# Patient Record
Sex: Female | Born: 1990
Health system: Southern US, Community
[De-identification: ages and names within clinical notes are randomized; demographics above are authoritative.]

## PROBLEM LIST (undated history)

## (undated) DIAGNOSIS — L509 Urticaria, unspecified: Secondary | ICD-10-CM

## (undated) DIAGNOSIS — R569 Unspecified convulsions: Secondary | ICD-10-CM

## (undated) HISTORY — DX: Urticaria, unspecified: L50.9

---

## 2014-04-24 ENCOUNTER — Emergency Department (HOSPITAL_COMMUNITY): Payer: PRIVATE HEALTH INSURANCE

## 2014-04-24 ENCOUNTER — Encounter (HOSPITAL_COMMUNITY): Payer: Self-pay | Admitting: Emergency Medicine

## 2014-04-24 ENCOUNTER — Emergency Department (HOSPITAL_COMMUNITY)
Admission: EM | Admit: 2014-04-24 | Discharge: 2014-04-24 | Disposition: A | Discharge status: Home / Self Care | Payer: PRIVATE HEALTH INSURANCE | Attending: Emergency Medicine | Admitting: Emergency Medicine

## 2014-04-24 DIAGNOSIS — S0083XA Contusion of other part of head, initial encounter: Secondary | ICD-10-CM

## 2014-04-24 DIAGNOSIS — Y9389 Activity, other specified: Secondary | ICD-10-CM | POA: Diagnosis not present

## 2014-04-24 DIAGNOSIS — Z79899 Other long term (current) drug therapy: Secondary | ICD-10-CM | POA: Insufficient documentation

## 2014-04-24 DIAGNOSIS — Y92410 Unspecified street and highway as the place of occurrence of the external cause: Secondary | ICD-10-CM | POA: Insufficient documentation

## 2014-04-24 DIAGNOSIS — S01111A Laceration without foreign body of right eyelid and periocular area, initial encounter: Secondary | ICD-10-CM | POA: Diagnosis not present

## 2014-04-24 DIAGNOSIS — S6991XA Unspecified injury of right wrist, hand and finger(s), initial encounter: Secondary | ICD-10-CM | POA: Diagnosis not present

## 2014-04-24 HISTORY — DX: Unspecified convulsions: R56.9

## 2014-04-24 MED ORDER — ACETAMINOPHEN 325 MG PO TABS
650.0000 mg | ORAL_TABLET | Freq: Once | ORAL | Status: AC
  Administered 2014-04-24: 650 mg via ORAL
  Filled 2014-04-24: qty 2

## 2014-04-24 MED ORDER — NAPROXEN 500 MG PO TABS: 500.0000 mg | ORAL_TABLET | Freq: Two times a day (BID) | ORAL | Status: DC

## 2014-04-24 MED ORDER — HYDROCODONE-ACETAMINOPHEN 5-325 MG PO TABS: 2.0000 | ORAL_TABLET | ORAL | Status: DC | PRN

## 2014-04-24 MED ORDER — LIDOCAINE-EPINEPHRINE (PF) 2 %-1:200000 IJ SOLN
10.0000 mL | Freq: Once | INTRAMUSCULAR | Status: AC
  Administered 2014-04-24: 10 mL via INTRADERMAL
  Filled 2014-04-24: qty 20

## 2014-04-24 MED ORDER — METHOCARBAMOL 500 MG PO TABS: 500.0000 mg | ORAL_TABLET | Freq: Two times a day (BID) | ORAL | Status: DC
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
## 2014-04-24 NOTE — ED Notes (Signed)
MD at the bedside  

## 2014-04-24 NOTE — ED Notes (Signed)
MD Fayrene FearingJames stated patient did not need a pregnancy test.

## 2014-04-24 NOTE — Discharge Instructions (Signed)
Suture removal in 5-7 days.  Contusion A contusion is a deep bruise. Contusions are the result of an injury that caused bleeding under the skin. The contusion may turn blue, purple, or yellow. Minor injuries will give you a painless contusion, but more severe contusions may stay painful and swollen for a few weeks.  CAUSES  A contusion is usually caused by a blow, trauma, or direct force to an area of the body. SYMPTOMS   Swelling and redness of the injured area.  Bruising of the injured area.  Tenderness and soreness of the injured area.  Pain. DIAGNOSIS  The diagnosis can be made by taking a history and physical exam. An X-ray, CT scan, or MRI may be needed to determine if there were any associated injuries, such as fractures. TREATMENT  Specific treatment will depend on what area of the body was injured. In general, the best treatment for a contusion is resting, icing, elevating, and applying cold compresses to the injured area. Over-the-counter medicines may also be recommended for pain control. Ask your caregiver what the best treatment is for your contusion. HOME CARE INSTRUCTIONS   Put ice on the injured area.  Put ice in a plastic bag.  Place a towel between your skin and the bag.  Leave the ice on for 15-20 minutes, 3-4 times a day, or as directed by your health care provider.  Only take over-the-counter or prescription medicines for pain, discomfort, or fever as directed by your caregiver. Your caregiver may recommend avoiding anti-inflammatory medicines (aspirin, ibuprofen, and naproxen) for 48 hours because these medicines may increase bruising.  Rest the injured area.  If possible, elevate the injured area to reduce swelling. SEEK IMMEDIATE MEDICAL CARE IF:   You have increased bruising or swelling.  You have pain that is getting worse.  Your swelling or pain is not relieved with medicines. MAKE SURE YOU:   Understand these instructions.  Will watch your  condition.  Will get help right away if you are not doing well or get worse. Document Released: 03/24/2005 Document Revised: 06/19/2013 Document Reviewed: 04/19/2011 [facility] LodgeExitCare Patient Information 2015 [place]Care, [place]. This information is not intended to replace advice given to you by your health care provider. Make sure you discuss any questions you have with your health care provider.  Facial Laceration  A facial laceration is a cut on the face. These injuries can be painful and cause bleeding. Lacerations usually heal quickly, but they need special care to reduce scarring. DIAGNOSIS  Your health care provider will take a medical history, ask for details about how the injury occurred, and examine the wound to determine how deep the cut is. TREATMENT  Some facial lacerations may not require closure. Others may not be able to be closed because of an increased risk of infection. The risk of infection and the chance for successful closure will depend on various factors, including the amount of time since the injury occurred. The wound may be cleaned to help prevent infection. If closure is appropriate, pain medicines may be given if needed. Your health care provider will use stitches (sutures), wound glue (adhesive), or skin adhesive strips to repair the laceration. These tools bring the skin edges together to allow for faster healing and a better cosmetic outcome. If needed, you may also be given a tetanus shot. HOME CARE INSTRUCTIONS  Only take over-the-counter or prescription medicines as directed by your health care provider.  Follow your health care provider's instructions for wound care. These instructions  will vary depending on the technique used for closing the wound. For Sutures:  Keep the wound clean and dry.   If you were given a bandage (dressing), you should change it at least once a day. Also change the dressing if it becomes wet or dirty, or as directed by your health care provider.    Wash the wound with soap and water 2 times a day. Rinse the wound off with water to remove all soap. Pat the wound dry with a clean towel.   After cleaning, apply a thin layer of the antibiotic ointment recommended by your health care provider. This will help prevent infection and keep the dressing from sticking.   You may shower as usual after the first 24 hours. Do not soak the wound in water until the sutures are removed.   Get your sutures removed as directed by your health care provider. With facial lacerations, sutures should usually be taken out after 4-5 days to avoid stitch marks.   Wait a few days after your sutures are removed before applying any makeup. For Skin Adhesive Strips:  Keep the wound clean and dry.   Do not get the skin adhesive strips wet. You may bathe carefully, using caution to keep the wound dry.   If the wound gets wet, pat it dry with a clean towel.   Skin adhesive strips will fall off on their own. You may trim the strips as the wound heals. Do not remove skin adhesive strips that are still stuck to the wound. They will fall off in time.  For Wound Adhesive:  You may briefly wet your wound in the shower or bath. Do not soak or scrub the wound. Do not swim. Avoid periods of heavy sweating until the skin adhesive has fallen off on its own. After showering or bathing, gently pat the wound dry with a clean towel.   Do not apply liquid medicine, cream medicine, ointment medicine, or makeup to your wound while the skin adhesive is in place. This may loosen the film before your wound is healed.   If a dressing is placed over the wound, be careful not to apply tape directly over the skin adhesive. This may cause the adhesive to be pulled off before the wound is healed.   Avoid prolonged exposure to sunlight or tanning lamps while the skin adhesive is in place.  The skin adhesive will usually remain in place for 5-10 days, then naturally fall off the  skin. Do not pick at the adhesive film.  After Healing: Once the wound has healed, cover the wound with sunscreen during the day for 1 full year. This can help minimize scarring. Exposure to ultraviolet light in the first year will darken the scar. It can take 1-2 years for the scar to lose its redness and to heal completely.  SEEK IMMEDIATE MEDICAL CARE IF:  You have redness, pain, or swelling around the wound.   You see ayellowish-white fluid (pus) coming from the wound.   You have chills or a fever.  MAKE SURE YOU:  Understand these instructions.  Will watch your condition.  Will get help right away if you are not doing well or get worse. Document Released: 07/22/2004 Document Revised: 04/04/2013 Document Reviewed: 01/25/2013 [facility] Patient Information 2015 [place], [place]. This information is not intended to replace advice given to you by your health care provider. Make sure you discuss any questions you have with your health care provider.
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
## 2014-04-24 NOTE — ED Notes (Signed)
Patient returned from CT

## 2015-01-15 ENCOUNTER — Encounter: Payer: Self-pay | Admitting: Family Medicine
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
## 2016-03-27 ENCOUNTER — Ambulatory Visit (INDEPENDENT_AMBULATORY_CARE_PROVIDER_SITE_OTHER): Payer: BLUE CROSS/BLUE SHIELD

## 2016-03-27 DIAGNOSIS — Z23 Encounter for immunization: Secondary | ICD-10-CM

## 2016-07-13 DIAGNOSIS — Z124 Encounter for screening for malignant neoplasm of cervix: Secondary | ICD-10-CM | POA: Diagnosis not present

## 2016-07-13 DIAGNOSIS — Z309 Encounter for contraceptive management, unspecified: Secondary | ICD-10-CM | POA: Diagnosis not present

## 2016-07-13 DIAGNOSIS — Z01419 Encounter for gynecological examination (general) (routine) without abnormal findings: Secondary | ICD-10-CM | POA: Diagnosis not present

## 2017-05-18 DIAGNOSIS — Z23 Encounter for immunization: Secondary | ICD-10-CM | POA: Diagnosis not present

## 2017-10-28 ENCOUNTER — Ambulatory Visit (INDEPENDENT_AMBULATORY_CARE_PROVIDER_SITE_OTHER): Payer: BLUE CROSS/BLUE SHIELD | Admitting: Obstetrics & Gynecology

## 2017-10-28 ENCOUNTER — Encounter: Payer: Self-pay | Admitting: Obstetrics & Gynecology

## 2017-10-28 VITALS — BP 120/70 | Ht 67.0 in | Wt 144.0 lb

## 2017-10-28 DIAGNOSIS — Z124 Encounter for screening for malignant neoplasm of cervix: Secondary | ICD-10-CM | POA: Diagnosis not present

## 2017-10-28 DIAGNOSIS — Z0000 Encounter for general adult medical examination without abnormal findings: Secondary | ICD-10-CM | POA: Diagnosis not present

## 2017-10-28 MED ORDER — NUVARING 0.12-0.015 MG/24HR VA RING: VAGINAL_RING | VAGINAL | 12 refills | Status: DC

## 2017-10-28 NOTE — Progress Notes (Signed)
HPI:      [name] is a 27 y.o. G0P0000 who LMP was Patient's last menstrual period was 10/21/2017., she presents today for her annual examination. The patient has no complaints today. The patient is sexually active. Her last pap: approximate date 2018 normal and was normal and 2014 was ASCUS w normal biopsies then. The patient does perform self breast exams.  There is no notable family history of breast or ovarian cancer in her family.  The patient has regular exercise: yes.  The patient denies current symptoms of depression.    GYN History: Contraception: NuvaRing vaginal inserts  PMHx: Past Medical History:  Diagnosis Date  . Seizure [facility])    History reviewed. No pertinent surgical history. History reviewed. No pertinent family history. Social History   Tobacco Use  . Smoking status: Never Smoker  . Smokeless tobacco: Never Used  Substance Use Topics  . Alcohol use: Yes  . Drug use: Not Currently    Current Outpatient Medications:  .  NUVARING 0.12-0.015 MG/24HR vaginal ring, INSERT 1 RING VAGINALLY ONCE A MONTH LEAVE IN PLACE FOR 24 DAYS. REMOVE FOR 4 DAYS, Disp: , Rfl: 12 .  acetaminophen (TYLENOL) 500 MG tablet, Take 500 mg by mouth every 6 (six) hours as needed., Disp: , Rfl:  .  HYDROcodone-acetaminophen (NORCO/VICODIN) 5-325 MG per tablet, Take 2 tablets by mouth every 4 (four) hours as needed. (Patient not taking: Reported on 10/28/2017), Disp: 10 tablet, Rfl: 0 .  methocarbamol (ROBAXIN) 500 MG tablet, Take 1 tablet (500 mg total) by mouth 2 (two) times daily. (Patient not taking: Reported on 10/28/2017), Disp: 20 tablet, Rfl: 0 .  naproxen (NAPROSYN) 500 MG tablet, Take 1 tablet (500 mg total) by mouth 2 (two) times daily. (Patient not taking: Reported on 10/28/2017), Disp: 30 tablet, Rfl: 0 .  norethindrone-ethinyl estradiol (JUNEL FE,GILDESS FE,LOESTRIN FE) 1-20 MG-MCG tablet, Take 1 tablet by mouth daily., Disp: , Rfl:  Allergies: Patient has no known  allergies.  Review of Systems  Constitutional: Negative for chills, fever and malaise/fatigue.  HENT: Negative for congestion, sinus pain and sore throat.   Eyes: Negative for blurred vision and pain.  Respiratory: Negative for cough and wheezing.   Cardiovascular: Negative for chest pain and leg swelling.  Gastrointestinal: Negative for abdominal pain, constipation, diarrhea, heartburn, nausea and vomiting.  Genitourinary: Negative for dysuria, frequency, hematuria and urgency.  Musculoskeletal: Negative for back pain, joint pain, myalgias and neck pain.  Skin: Negative for itching and rash.  Neurological: Negative for dizziness, tremors and weakness.  Endo/Heme/Allergies: Does not bruise/bleed easily.  Psychiatric/Behavioral: Negative for depression. The patient is not nervous/anxious and does not have insomnia.    Objective: BP 120/70   Ht  (1.702 m)   Wt 144 lb (65.3 kg)   LMP 10/21/2017   BMI 22.55 kg/m   Filed Weights   10/28/17 0816  Weight: 144 lb (65.3 kg)   Body mass index is 22.55 kg/m. Physical Exam  Constitutional: She is oriented to person, place, and time. She appears well-developed and well-nourished. No distress.  Genitourinary: Rectum normal, vagina normal and uterus normal. Pelvic exam was performed with patient supine. There is no rash or lesion on the right labia. There is no rash or lesion on the left labia. Vagina exhibits no lesion. No bleeding in the vagina. Right adnexum does not display mass and does not display tenderness. Left adnexum does not display mass and does not display tenderness. Cervix does not exhibit motion tenderness,  lesion, friability or polyp.   Uterus is mobile and midaxial. Uterus is not enlarged or exhibiting a mass.  HENT:  Head: Normocephalic and atraumatic. Head is without laceration.  Right Ear: Hearing normal.  Left Ear: Hearing normal.  Nose: No epistaxis.  No foreign bodies.  Mouth/Throat: Uvula is midline, oropharynx  is clear and moist and mucous membranes are normal.  Eyes: Pupils are equal, round, and reactive to light.  Neck: Normal range of motion. Neck supple. No thyromegaly present.  Cardiovascular: Normal rate and regular rhythm. Exam reveals no gallop and no friction rub.  No murmur heard. Pulmonary/Chest: Effort normal and breath sounds normal. No respiratory distress. She has no wheezes. Right breast exhibits no mass, no skin change and no tenderness. Left breast exhibits no mass, no skin change and no tenderness.  Abdominal: Soft. Bowel sounds are normal. She exhibits no distension. There is no tenderness. There is no rebound.  Musculoskeletal: Normal range of motion.  Neurological: She is alert and oriented to person, place, and time. No cranial nerve deficit.  Skin: Skin is warm and dry.  Psychiatric: She has a normal mood and affect. Judgment normal.  Vitals reviewed.  Assessment:   1. Annual physical exam   2. Screening for cervical cancer    Screening Plan:            1.  Cervical Screening-  Pap smear done today, annually  2.  Labs managed by PCP  5.  Counseling for contraception: NuvaRing vaginal inserts    F/U  Return in about 1 year (around 10/29/2018) for Annual.  Annamarie Major, MD, [facility], [facility] 10/28/2017  8:21 AM

## 2017-10-28 NOTE — Patient Instructions (Signed)
Ethinyl Estradiol; Etonogestrel vaginal ring What is this medicine? ETHINYL ESTRADIOL; ETONOGESTREL (ETH in il es tra DYE ole; et oh noe JES trel) vaginal ring is a flexible, vaginal ring used as a contraceptive (birth control method). This medicine combines two types of female hormones, an estrogen and a progestin. This ring is used to prevent ovulation and pregnancy. Each ring is effective for one month. This medicine may be used for other purposes; ask your health care provider or pharmacist if you have questions. COMMON BRAND NAME(S): NuvaRing What should I tell my health care provider before I take this medicine? They need to know if you have or ever had any of these conditions: -abnormal vaginal bleeding -blood vessel disease or blood clots -breast, cervical, endometrial, ovarian, liver, or uterine cancer -diabetes -gallbladder disease -heart disease or recent heart attack -high blood pressure -high cholesterol -kidney disease -liver disease -migraine headaches -stroke -systemic lupus erythematosus (SLE) -tobacco smoker -an unusual or allergic reaction to estrogens, progestins, other medicines, foods, dyes, or preservatives -pregnant or trying to get pregnant -breast-feeding How should I use this medicine? Insert the ring into your vagina as directed. Follow the directions on the prescription label. The ring will remain place for 3 weeks and is then removed for a 1-week break. A new ring is inserted 1 week after the last ring was removed, on the same day of the week. Check often to make sure the ring is still in place, especially before and after sexual intercourse. If the ring was out of the vagina for an unknown amount of time, you may not be protected from pregnancy. Perform a pregnancy test and call your doctor. Do not use more often than directed. A patient package insert for the product will be given with each prescription and refill. Read this sheet carefully each time. The  sheet may change frequently. Contact your pediatrician regarding the use of this medicine in children. Special care may be needed. This medicine has been used in female children who have started having menstrual periods. Overdosage: If you think you have taken too much of this medicine contact a poison control center or emergency room at once. NOTE: This medicine is only for you. Do not share this medicine with others. What if I miss a dose? You will need to replace your vaginal ring once a month as directed. If the ring should slip out, or if you leave it in longer or shorter than you should, contact your health care professional for advice. What may interact with this medicine? Do not take this medicine with the following medication: -dasabuvir; ombitasvir; paritaprevir; ritonavir -ombitasvir; paritaprevir; ritonavir This medicine may also interact with the following medications: -acetaminophen -antibiotics or medicines for infections, especially rifampin, rifabutin, rifapentine, and griseofulvin, and possibly penicillins or tetracyclines -aprepitant -ascorbic acid (vitamin C) -atorvastatin -barbiturate medicines, such as phenobarbital -bosentan -carbamazepine -caffeine -clofibrate -cyclosporine -dantrolene -doxercalciferol -felbamate -grapefruit juice -hydrocortisone -medicines for anxiety or sleeping problems, such as diazepam or temazepam -medicines for diabetes, including pioglitazone -modafinil -mycophenolate -nefazodone -oxcarbazepine -phenytoin -prednisolone -ritonavir or other medicines for HIV infection or AIDS -rosuvastatin -selegiline -soy isoflavones supplements -St. John's wort -tamoxifen or raloxifene -theophylline -thyroid hormones -topiramate -warfarin This list may not describe all possible interactions. Give your health care provider a list of all the medicines, herbs, non-prescription drugs, or dietary supplements you use. Also tell them if you smoke,  drink alcohol, or use illegal drugs. Some items may interact with your medicine. What should I watch for while using   this medicine? Visit your doctor or health care professional for regular checks on your progress. You will need a regular breast and pelvic exam and Pap smear while on this medicine. Use an additional method of contraception during the first cycle that you use this ring. Do not use a diaphragm or female condom, as the ring can interfere with these birth control methods and their proper placement. If you have any reason to think you are pregnant, stop using this medicine right away and contact your doctor or health care professional. If you are using this medicine for hormone related problems, it may take several cycles of use to see improvement in your condition. Smoking increases the risk of getting a blood clot or having a stroke while you are using hormonal birth control, especially if you are more than 29 years old. You are strongly advised not to smoke. This medicine can make your body retain fluid, making your fingers, hands, or ankles swell. Your blood pressure can go up. Contact your doctor or health care professional if you feel you are retaining fluid. This medicine can make you more sensitive to the sun. Keep out of the sun. If you cannot avoid being in the sun, wear protective clothing and use sunscreen. Do not use sun lamps or tanning beds/booths. If you wear contact lenses and notice visual changes, or if the lenses begin to feel uncomfortable, consult your eye care specialist. In some women, tenderness, swelling, or minor bleeding of the gums may occur. Notify your dentist if this happens. Brushing and flossing your teeth regularly may help limit this. See your dentist regularly and inform your dentist of the medicines you are taking. If you are going to have elective surgery, you may need to stop using this medicine before the surgery. Consult your health care professional  for advice. This medicine does not protect you against HIV infection (AIDS) or any other sexually transmitted diseases. What side effects may I notice from receiving this medicine? Side effects that you should report to your doctor or health care professional as soon as possible: -breast tissue changes or discharge -changes in vaginal bleeding during your period or between your periods -chest pain -coughing up blood -dizziness or fainting spells -headaches or migraines -leg, arm or groin pain -severe or sudden headaches -stomach pain (severe) -sudden shortness of breath -sudden loss of coordination, especially on one side of the body -speech problems -symptoms of vaginal infection like itching, irritation or unusual discharge -tenderness in the upper abdomen -vomiting -weakness or numbness in the arms or legs, especially on one side of the body -yellowing of the eyes or skin Side effects that usually do not require medical attention (report to your doctor or health care professional if they continue or are bothersome): -breakthrough bleeding and spotting that continues beyond the 3 initial cycles of pills -breast tenderness -mood changes, anxiety, depression, frustration, anger, or emotional outbursts -increased sensitivity to sun or ultraviolet light -nausea -skin rash, acne, or brown spots on the skin -weight gain (slight) This list may not describe all possible side effects. Call your doctor for medical advice about side effects. You may report side effects to FDA at 1-800-FDA-1088. Where should I keep my medicine? Keep out of the reach of children. Store at room temperature between 15 and 30 degrees C (59 and 86 degrees F) for up to 4 months. The product will expire after 4 months. Protect from light. Throw away any unused medicine after the expiration date. NOTE: This   sheet is a summary. It may not cover all possible information. If you have questions about this medicine, talk  to your doctor, pharmacist, or health care provider.  2018 Elsevier/Gold Standard (2016-02-20 17:00:31)  

## 2017-11-01 LAB — IGP, RFX APTIMA HPV ASCU: PAP Smear Comment: 0

## 2017-11-06 ENCOUNTER — Other Ambulatory Visit: Payer: Self-pay | Admitting: Obstetrics & Gynecology

## 2018-06-19 ENCOUNTER — Ambulatory Visit (INDEPENDENT_AMBULATORY_CARE_PROVIDER_SITE_OTHER): Payer: BLUE CROSS/BLUE SHIELD | Admitting: Family Medicine

## 2018-06-19 ENCOUNTER — Encounter: Payer: Self-pay | Admitting: Family Medicine

## 2018-06-19 VITALS — BP 122/68 | HR 84 | Temp 98.4°F | Resp 16 | Ht 67.0 in | Wt 149.0 lb

## 2018-06-19 DIAGNOSIS — K121 Other forms of stomatitis: Secondary | ICD-10-CM

## 2018-06-19 DIAGNOSIS — Z0000 Encounter for general adult medical examination without abnormal findings: Secondary | ICD-10-CM | POA: Diagnosis not present

## 2018-06-19 NOTE — Progress Notes (Signed)
.   Patient: Audrey Mullins, Female    DOB: September 25, 1990, 27 y.o.   MRN: [id number] Visit Date: 06/19/2018  Today's Provider: Megan Mansichard Gilbert Jr, MD   Chief Complaint  Patient presents with  . Annual Exam   Subjective:    Annual physical exam Audrey QuailsJessica Gawron is a 27 y.o. female who presents today for health maintenance and complete physical. She feels well. She reports exercising not regularly, but she does stay active. She reports she is sleeping well. GYN care per Dr. Tiburcio PeaHarris. She is presently pursuing her masters degree at [employer] and will graduate in May 2021.  Boyfriend is pursuing his PhD.  She had the flu shot.  Overall she feels well.  Only issue she has now is recurrent mouth ulcers for which she takes  Dukes mouthwash successfully Tdap- 08/26/2009.  Pap- 10/28/2017. GYN. Normal.     Review of Systems  Constitutional: Negative.   HENT: Negative.   Eyes: Negative.   Respiratory: Negative.   Cardiovascular: Negative.   Gastrointestinal: Negative.   Endocrine: Negative.   Genitourinary: Negative.   Musculoskeletal: Negative.   Skin: Negative.   Allergic/Immunologic: Negative.   Neurological: Negative.   Hematological: Negative.   Psychiatric/Behavioral: Negative.     Social History      She  reports that she has never smoked. She has never used smokeless tobacco. She reports current alcohol use. She reports previous drug use.       Social History   Socioeconomic History  . Marital status: Single    Spouse name: Not on file  . Number of children: Not on file  . Years of education: Not on file  . Highest education level: Not on file  Occupational History  . Not on file  Social Needs  . Financial resource strain: Not on file  . Food insecurity:    Worry: Not on file    Inability: Not on file  . Transportation needs:    Medical: Not on file    Non-medical: Not on file  Tobacco Use  . Smoking status: Never Smoker  . Smokeless tobacco: Never Used  Substance  and Sexual Activity  . Alcohol use: Yes  . Drug use: Not Currently  . Sexual activity: Yes    Birth control/protection: Inserts  Lifestyle  . Physical activity:    Days per week: Not on file    Minutes per session: Not on file  . Stress: Not on file  Relationships  . Social connections:    Talks on phone: Not on file    Gets together: Not on file    Attends religious service: Not on file    Active member of club or organization: Not on file    Attends meetings of clubs or organizations: Not on file    Relationship status: Not on file  Other Topics Concern  . Not on file  Social History Narrative  . Not on file    Past Medical History:  Diagnosis Date  . Seizure (HCC)      There are no active problems to display for this patient.   No past surgical history on file.  Family History        Family Status  Relation Name Status  . Mother  Alive  . Father  Alive  . Sister  Alive  . Brother  Alive  . Brother  Alive  . Brother  Alive        Her family history is not on  file.      No Known Allergies   Current Outpatient Medications:  .  acetaminophen (TYLENOL) 500 MG tablet, Take 500 mg by mouth every 6 (six) hours as needed., Disp: , Rfl:  .  NUVARING 0.12-0.015 MG/24HR vaginal ring, INSERT 1 RING VAGINALLY ONCE A MONTH LEAVE IN PLACE FOR 24 DAYS. REMOVE FOR 4 DAYS, Disp: 3 each, Rfl: 4 .  HYDROcodone-acetaminophen (NORCO/VICODIN) 5-325 MG per tablet, Take 2 tablets by mouth every 4 (four) hours as needed. (Patient not taking: Reported on 10/28/2017), Disp: 10 tablet, Rfl: 0 .  methocarbamol (ROBAXIN) 500 MG tablet, Take 1 tablet (500 mg total) by mouth 2 (two) times daily. (Patient not taking: Reported on 10/28/2017), Disp: 20 tablet, Rfl: 0 .  naproxen (NAPROSYN) 500 MG tablet, Take 1 tablet (500 mg total) by mouth 2 (two) times daily. (Patient not taking: Reported on 10/28/2017), Disp: 30 tablet, Rfl: 0   Patient Care Team: Maple Hudson., MD as PCP - General  (Family Medicine)      Objective:   Vitals: BP 122/68 (BP Location: Right Arm, Patient Position: Sitting, Cuff Size: Normal)   Pulse 84   Temp 98.4 F (36.9 C)   Resp 16   Ht 5\' 7"  (1.702 m)   Wt 149 lb (67.6 kg)   SpO2 97%   BMI 23.34 kg/m    Vitals:   06/19/18 1345  BP: 122/68  Pulse: 84  Resp: 16  Temp: 98.4 F (36.9 C)  SpO2: 97%  Weight: 149 lb (67.6 kg)  Height: 5\' 7"  (1.702 m)     Physical Exam Vitals signs reviewed.  Constitutional:      Appearance: Normal appearance.  HENT:     Head: Normocephalic and atraumatic.     Right Ear: Tympanic membrane and external ear normal.     Left Ear: Tympanic membrane and external ear normal.     Nose: Nose normal.     Mouth/Throat:     Pharynx: Oropharynx is clear.  Eyes:     General: No scleral icterus.    Conjunctiva/sclera: Conjunctivae normal.     Pupils: Pupils are equal, round, and reactive to light.  Cardiovascular:     Rate and Rhythm: Normal rate and regular rhythm.     Pulses: Normal pulses.     Heart sounds: Normal heart sounds.  Pulmonary:     Effort: Pulmonary effort is normal.     Breath sounds: Normal breath sounds.  Abdominal:     General: Abdomen is flat.     Palpations: Abdomen is soft.  Musculoskeletal:        General: No swelling.  Lymphadenopathy:     Cervical: No cervical adenopathy.  Skin:    General: Skin is warm and dry.  Neurological:     General: No focal deficit present.     Mental Status: She is alert and oriented to person, place, and time. Mental status is at baseline.  Psychiatric:        Mood and Affect: Mood normal.        Behavior: Behavior normal.        Thought Content: Thought content normal.        Judgment: Judgment normal.      Depression Screen PHQ 2/9 Scores 06/19/2018  PHQ - 2 Score 1      Assessment & Plan:     Routine Health Maintenance and Physical Exam  Exercise Activities and Dietary recommendations Goals   None  Immunization History    Administered Date(s) Administered  . Influenza,inj,Quad PF,6+ Mos 03/27/2016    Health Maintenance  Topic Date Due  . HIV Screening  11/14/2003  . TETANUS/TDAP  11/14/2007  . PAP-Cervical Cytology Screening  11/13/2009  . INFLUENZA VACCINE  01/26/2018  . PAP SMEAR-Modifier  10/28/2020    Overall very healthy.  Suggest using lysine daily to try to prevent the mouth ulcers.  Probable Tdap on her next physical in a couple of years Discussed health benefits of physical activity, and encouraged her to engage in regular exercise appropriate for her age and condition.  I have done the exam and reviewed the above chart and it is accurate to the best of my knowledge. [employer]  technology has been used in this note in any air is in the dictation or transcription are unintentional.  Megan Mansichard Gilbert Jr, MD  [facility]

## 2018-06-19 NOTE — Patient Instructions (Signed)
Try Lysine daily to help with mouth ulcers.

## 2018-06-20 LAB — POCT URINALYSIS DIPSTICK
Bilirubin, UA: NEGATIVE
Blood, UA: NEGATIVE
GLUCOSE UA: NEGATIVE
Ketones, UA: NEGATIVE
Leukocytes, UA: NEGATIVE
Nitrite, UA: NEGATIVE
Protein, UA: NEGATIVE
SPEC GRAV UA: 1.01 (ref 1.010–1.025)
UROBILINOGEN UA: 0.2 U/dL
pH, UA: 6.5 (ref 5.0–8.0)

## 2018-06-20 LAB — COMPREHENSIVE METABOLIC PANEL
ALBUMIN: 4.3 g/dL (ref 3.5–5.5)
ALT: 13 IU/L (ref 0–32)
AST: 14 IU/L (ref 0–40)
Albumin/Globulin Ratio: 2.3 — ABNORMAL HIGH (ref 1.2–2.2)
Alkaline Phosphatase: 68 IU/L (ref 39–117)
BUN/Creatinine Ratio: 10 (ref 9–23)
BUN: 8 mg/dL (ref 6–20)
Bilirubin Total: 0.3 mg/dL (ref 0.0–1.2)
CO2: 23 mmol/L (ref 20–29)
CREATININE: 0.82 mg/dL (ref 0.57–1.00)
Calcium: 9.2 mg/dL (ref 8.7–10.2)
Chloride: 101 mmol/L (ref 96–106)
GFR calc Af Amer: 112 mL/min/{1.73_m2} (ref >59)
GFR calc non Af Amer: 97 mL/min/{1.73_m2} (ref >59)
GLOBULIN, TOTAL: 1.9 g/dL (ref 1.5–4.5)
Glucose: 86 mg/dL (ref 65–99)
Potassium: 4.2 mmol/L (ref 3.5–5.2)
SODIUM: 139 mmol/L (ref 134–144)
Total Protein: 6.2 g/dL (ref 6.0–8.5)

## 2018-06-20 LAB — TSH: TSH: 1.53 u[IU]/mL (ref 0.450–4.500)

## 2018-06-20 LAB — LIPID PANEL
CHOL/HDL RATIO: 2.1 ratio (ref 0.0–4.4)
Cholesterol, Total: 171 mg/dL (ref 100–199)
HDL: 83 mg/dL (ref >39)
LDL CALC: 79 mg/dL (ref 0–99)
TRIGLYCERIDES: 47 mg/dL (ref 0–149)
VLDL Cholesterol Cal: 9 mg/dL (ref 5–40)

## 2018-06-20 LAB — CBC WITH DIFFERENTIAL/PLATELET
Basophils Absolute: 0.1 10*3/uL (ref 0.0–0.2)
Basos: 1 %
EOS (ABSOLUTE): 0.4 10*3/uL (ref 0.0–0.4)
EOS: 8 %
HEMATOCRIT: 39.9 % (ref 34.0–46.6)
HEMOGLOBIN: 13.8 g/dL (ref 11.1–15.9)
IMMATURE GRANULOCYTES: 1 %
Immature Grans (Abs): 0 10*3/uL (ref 0.0–0.1)
Lymphocytes Absolute: 2 10*3/uL (ref 0.7–3.1)
Lymphs: 37 %
MCH: 31.2 pg (ref 26.6–33.0)
MCHC: 34.6 g/dL (ref 31.5–35.7)
MCV: 90 fL (ref 79–97)
Monocytes Absolute: 0.5 10*3/uL (ref 0.1–0.9)
Monocytes: 8 %
Neutrophils Absolute: 2.4 10*3/uL (ref 1.4–7.0)
Neutrophils: 45 %
Platelets: 275 10*3/uL (ref 150–450)
RBC: 4.43 x10E6/uL (ref 3.77–5.28)
RDW: 12.1 % — ABNORMAL LOW (ref 12.3–15.4)
WBC: 5.3 10*3/uL (ref 3.4–10.8)

## 2018-11-15 ENCOUNTER — Other Ambulatory Visit: Payer: Self-pay | Admitting: Obstetrics & Gynecology

## 2018-11-16 NOTE — Telephone Encounter (Signed)
Sch annual in Jun-July Refills done

## 2018-11-16 NOTE — Telephone Encounter (Signed)
Called and left generic message for patient to call back to be schedule °

## 2019-02-03 ENCOUNTER — Other Ambulatory Visit: Payer: Self-pay | Admitting: Obstetrics & Gynecology

## 2019-02-05 NOTE — Telephone Encounter (Signed)
Voicemail box is full unable to leave voicemail 

## 2019-02-05 NOTE — Telephone Encounter (Signed)
Sch Annual; refill done

## 2019-05-01 ENCOUNTER — Other Ambulatory Visit: Payer: Self-pay | Admitting: Obstetrics & Gynecology

## 2019-07-02 DIAGNOSIS — J301 Allergic rhinitis due to pollen: Secondary | ICD-10-CM | POA: Diagnosis not present

## 2019-07-02 DIAGNOSIS — J305 Allergic rhinitis due to food: Secondary | ICD-10-CM | POA: Diagnosis not present

## 2019-07-02 DIAGNOSIS — J343 Hypertrophy of nasal turbinates: Secondary | ICD-10-CM | POA: Diagnosis not present

## 2019-07-02 DIAGNOSIS — J309 Allergic rhinitis, unspecified: Secondary | ICD-10-CM | POA: Diagnosis not present

## 2019-07-02 DIAGNOSIS — L500 Allergic urticaria: Secondary | ICD-10-CM | POA: Diagnosis not present

## 2019-07-02 DIAGNOSIS — J340 Abscess, furuncle and carbuncle of nose: Secondary | ICD-10-CM | POA: Diagnosis not present

## 2019-07-03 DIAGNOSIS — L858 Other specified epidermal thickening: Secondary | ICD-10-CM | POA: Diagnosis not present

## 2019-07-03 DIAGNOSIS — L209 Atopic dermatitis, unspecified: Secondary | ICD-10-CM | POA: Diagnosis not present

## 2019-07-03 DIAGNOSIS — L309 Dermatitis, unspecified: Secondary | ICD-10-CM | POA: Diagnosis not present

## 2019-07-03 DIAGNOSIS — L503 Dermatographic urticaria: Secondary | ICD-10-CM | POA: Diagnosis not present

## 2019-07-25 DIAGNOSIS — L500 Allergic urticaria: Secondary | ICD-10-CM | POA: Diagnosis not present

## 2019-08-08 DIAGNOSIS — L209 Atopic dermatitis, unspecified: Secondary | ICD-10-CM | POA: Diagnosis not present

## 2019-08-08 DIAGNOSIS — L503 Dermatographic urticaria: Secondary | ICD-10-CM | POA: Diagnosis not present

## 2019-08-08 DIAGNOSIS — L710 Perioral dermatitis: Secondary | ICD-10-CM | POA: Diagnosis not present

## 2019-09-03 DIAGNOSIS — L209 Atopic dermatitis, unspecified: Secondary | ICD-10-CM | POA: Diagnosis not present

## 2019-09-05 DIAGNOSIS — L209 Atopic dermatitis, unspecified: Secondary | ICD-10-CM | POA: Diagnosis not present

## 2019-09-10 ENCOUNTER — Other Ambulatory Visit: Payer: Self-pay

## 2019-09-10 ENCOUNTER — Ambulatory Visit: Payer: BC Managed Care – PPO | Admitting: Dermatology

## 2019-09-10 DIAGNOSIS — L710 Perioral dermatitis: Secondary | ICD-10-CM

## 2019-09-10 DIAGNOSIS — L249 Irritant contact dermatitis, unspecified cause: Secondary | ICD-10-CM | POA: Diagnosis not present

## 2019-09-10 DIAGNOSIS — L209 Atopic dermatitis, unspecified: Secondary | ICD-10-CM

## 2019-09-10 NOTE — Progress Notes (Addendum)
   Follow-Up Visit   Subjective  Audrey Mullins is a 29 y.o. female who presents for the following: Dermatitis (Pt did notice some redness on the wool alcohol patch site on the first day, but nothing thereafter. ). Dermatitis She complains of a rash. Symptoms began  06/30/2019 . Patient describes the rash as macules. Characteristics of rash and associated history: Is rash pruritic?  yes. Her previous dermatologic history includes dermatitis. Medications currently using:  Aczone 7.5% gel QAM for perioral dermatitis and Mometasone 0.1% cream QD-BID PRN for atopic dermatitis.   Environmental exposures or allergies: rag weed - high positive during allergy testing at [facility], peanuts - tested positive during allergy testing at [facility], but no physical reaction as of yet.    The following portions of the chart were reviewed this encounter and updated as appropriate:     Review of Systems: No other skin or systemic complaints.  Objective  Well appearing patient in no apparent distress; mood and affect are within normal limits.  An examination of the back, and face was performed today.   Objective  Face, trunk, extremities: Trunk and extremities clear today. Patch test location clear today.  Objective  Face, trunk, extremities: Patch testing site clear.   Objective  Periocular:   Face clear today other than 0.1 cm x 2 pink papules of the R periocular area.  Assessment & Plan  Atopic dermatitis, unspecified type Face, trunk, extremities  Continue Mometasone cream 0.1% QD-BID PRN flares. Recommend mild soaps and moisturizers daily. Patch testing clear today but questionable reaction to wool alcohols and ethylenediamine dihydrochloride. Pt should avoid these products and products containing these ingredients. Chemical product informational sheets given today.  Irritant dermatitis Face, trunk, extremities  The patient will observe these symptoms, and  report promptly any worsening or unexpected persistence.  If well, may return prn. Informational hand outs given for wool alcohol (+ first day per pt.) and ethylenediamine dihydrochloride (questionable)  Perioral dermatitis Periocular  The patient will observe these symptoms, and report promptly any worsening or unexpected persistence.  If well, may return prn. Wool alcohol and ethylenediamine dihydrochloride handouts given today. Continue Aczone 7.5% gel QAM. Return if symptoms worsen or fail to improve.

## 2019-09-11 ENCOUNTER — Other Ambulatory Visit: Payer: Self-pay | Admitting: Obstetrics & Gynecology

## 2019-09-11 ENCOUNTER — Telehealth: Payer: Self-pay

## 2019-09-11 MED ORDER — NUVARING 0.12-0.015 MG/24HR VA RING: VAGINAL_RING | VAGINAL | 0 refills | Status: DC

## 2019-09-11 NOTE — Telephone Encounter (Signed)
Pt is wanting a refill on her Nuvaring before her annual that's coming up however she is almost a year over due for her annual please advise if refill is okay. Thank you.

## 2019-09-27 DIAGNOSIS — H5213 Myopia, bilateral: Secondary | ICD-10-CM | POA: Diagnosis not present

## 2019-10-08 ENCOUNTER — Ambulatory Visit: Payer: BC Managed Care – PPO | Admitting: Obstetrics & Gynecology

## 2019-11-01 ENCOUNTER — Other Ambulatory Visit (HOSPITAL_COMMUNITY)
Admission: RE | Admit: 2019-11-01 | Discharge: 2019-11-01 | Disposition: A | Discharge status: Home / Self Care | Payer: BC Managed Care – PPO | Source: Ambulatory Visit | Attending: Obstetrics & Gynecology | Admitting: Obstetrics & Gynecology

## 2019-11-01 ENCOUNTER — Ambulatory Visit (INDEPENDENT_AMBULATORY_CARE_PROVIDER_SITE_OTHER): Payer: BC Managed Care – PPO | Admitting: Obstetrics & Gynecology

## 2019-11-01 ENCOUNTER — Other Ambulatory Visit: Payer: Self-pay

## 2019-11-01 ENCOUNTER — Encounter: Payer: Self-pay | Admitting: Obstetrics & Gynecology

## 2019-11-01 VITALS — BP 120/80 | Ht 67.0 in | Wt 134.0 lb

## 2019-11-01 DIAGNOSIS — Z3044 Encounter for surveillance of vaginal ring hormonal contraceptive device: Secondary | ICD-10-CM

## 2019-11-01 DIAGNOSIS — Z124 Encounter for screening for malignant neoplasm of cervix: Secondary | ICD-10-CM | POA: Diagnosis not present

## 2019-11-01 DIAGNOSIS — Z01419 Encounter for gynecological examination (general) (routine) without abnormal findings: Secondary | ICD-10-CM

## 2019-11-01 MED ORDER — NUVARING 0.12-0.015 MG/24HR VA RING: VAGINAL_RING | VAGINAL | 3 refills | Status: DC

## 2019-11-01 NOTE — Progress Notes (Signed)
HPI:      [name] is a 29 y.o. G0P0000 who LMP was Patient's last menstrual period was 10/26/2019., she presents today for her annual examination. The patient has no complaints today. The patient is sexually active. Her last pap: approximate date 2019 and was normal. The patient does perform self breast exams.  There is no notable family history of breast or ovarian cancer in her family.  The patient has regular exercise: yes.  The patient denies current symptoms of depression.  Reg cycles, 6 days.  GYN History: Contraception: NuvaRing vaginal inserts  PMHx: Past Medical History:  Diagnosis Date  . Seizure (HCC)   . Urticaria    History reviewed. No pertinent surgical history. Family History  Problem Relation Age of Onset  . Skin cancer Mother    Social History   Tobacco Use  . Smoking status: Never Smoker  . Smokeless tobacco: Never Used  Substance Use Topics  . Alcohol use: Yes  . Drug use: Not Currently    Current Outpatient Medications:  .  acetaminophen (TYLENOL) 500 MG tablet, Take 500 mg by mouth every 6 (six) hours as needed., Disp: , Rfl:  .  NUVARING 0.12-0.015 MG/24HR vaginal ring, Insert vaginally and leave in place for 3 consecutive weeks, then remove for 1 week., Disp: 3 each, Rfl: 3 Allergies: Mixed ragweed and Peanut-containing drug products  Review of Systems  Constitutional: Negative for chills, fever and malaise/fatigue.  HENT: Negative for congestion, sinus pain and sore throat.   Eyes: Negative for blurred vision and pain.  Respiratory: Negative for cough and wheezing.   Cardiovascular: Negative for chest pain and leg swelling.  Gastrointestinal: Negative for abdominal pain, constipation, diarrhea, heartburn, nausea and vomiting.  Genitourinary: Negative for dysuria, frequency, hematuria and urgency.  Musculoskeletal: Negative for back pain, joint pain, myalgias and neck pain.  Skin: Negative for itching and rash.  Neurological: Negative  for dizziness, tremors and weakness.  Endo/Heme/Allergies: Does not bruise/bleed easily.  Psychiatric/Behavioral: Negative for depression. The patient is not nervous/anxious and does not have insomnia.     Objective: BP 120/80   Ht 5\' 7"  (1.702 m)   Wt 134 lb (60.8 kg)   LMP 10/26/2019   BMI 20.99 kg/m   Filed Weights   11/01/19 1330  Weight: 134 lb (60.8 kg)   Body mass index is 20.99 kg/m. Physical Exam Constitutional:      General: She is not in acute distress.    Appearance: She is well-developed.  Genitourinary:     Pelvic exam was performed with patient supine.     Vagina, uterus and rectum normal.     No lesions in the vagina.     No vaginal bleeding.     No cervical motion tenderness, friability, lesion or polyp.     Uterus is mobile.     Uterus is not enlarged.     No uterine mass detected.    Uterus is midaxial.     No right or left adnexal mass present.     Right adnexa not tender.     Left adnexa not tender.  HENT:     Head: Normocephalic and atraumatic. No laceration.     Right Ear: Hearing normal.     Left Ear: Hearing normal.     Mouth/Throat:     Pharynx: Uvula midline.  Eyes:     Pupils: Pupils are equal, round, and reactive to light.  Neck:     Thyroid: No thyromegaly.  Cardiovascular:     Rate and Rhythm: Normal rate and regular rhythm.     Heart sounds: No murmur. No friction rub. No gallop.   Pulmonary:     Effort: Pulmonary effort is normal. No respiratory distress.     Breath sounds: Normal breath sounds. No wheezing.  Chest:     Breasts:        Right: No mass, skin change or tenderness.        Left: No mass, skin change or tenderness.  Abdominal:     General: Bowel sounds are normal. There is no distension.     Palpations: Abdomen is soft.     Tenderness: There is no abdominal tenderness. There is no rebound.  Musculoskeletal:        General: Normal range of motion.     Cervical back: Normal range of motion and neck supple.    Neurological:     Mental Status: She is alert and oriented to person, place, and time.     Cranial Nerves: No cranial nerve deficit.  Skin:    General: Skin is warm and dry.  Psychiatric:        Judgment: Judgment normal.  Vitals reviewed.     Assessment:  ANNUAL EXAM 1. Women's annual routine gynecological examination   2. Screening for cervical cancer   3. Encounter for surveillance of vaginal ring hormonal contraceptive device      Screening Plan:            1.  Cervical Screening-  Pap smear done today  2. Breast screening- Exam annually and mammogram>40 planned   3. Colonoscopy every 10 years, Hemoccult testing - after age 34  4. Labs managed by PCP  5. Counseling for contraception: NuvaRing vaginal inserts - NUVARING 0.12-0.015 MG/24HR vaginal ring; Insert vaginally and leave in place for 3 consecutive weeks, then remove for 1 week.  Dispense: 3 each; Refill: 3  Future pregnancy planning and questions answered    F/U  Return in about 1 year (around 10/31/2020) for Annual.  Barnett Applebaum, MD, [facility], [facility] 11/01/2019  1:58 PM

## 2019-11-05 LAB — CYTOLOGY - PAP
Comment: NEGATIVE
Diagnosis: NEGATIVE
High risk HPV: NEGATIVE

## 2019-12-03 ENCOUNTER — Other Ambulatory Visit: Payer: Self-pay | Admitting: Obstetrics & Gynecology

## 2019-12-03 DIAGNOSIS — Z3044 Encounter for surveillance of vaginal ring hormonal contraceptive device: Secondary | ICD-10-CM

## 2021-01-22 ENCOUNTER — Other Ambulatory Visit: Payer: Self-pay | Admitting: Obstetrics & Gynecology

## 2021-01-22 DIAGNOSIS — Z3044 Encounter for surveillance of vaginal ring hormonal contraceptive device: Secondary | ICD-10-CM

## 2021-09-29 DIAGNOSIS — F900 Attention-deficit hyperactivity disorder, predominantly inattentive type: Secondary | ICD-10-CM | POA: Diagnosis not present

## 2021-11-05 ENCOUNTER — Other Ambulatory Visit: Payer: Self-pay

## 2021-11-13 DIAGNOSIS — F900 Attention-deficit hyperactivity disorder, predominantly inattentive type: Secondary | ICD-10-CM | POA: Diagnosis not present

## 2021-11-13 DIAGNOSIS — F429 Obsessive-compulsive disorder, unspecified: Secondary | ICD-10-CM | POA: Diagnosis not present

## 2021-11-25 DIAGNOSIS — Z1151 Encounter for screening for human papillomavirus (HPV): Secondary | ICD-10-CM | POA: Diagnosis not present

## 2021-11-25 DIAGNOSIS — Z124 Encounter for screening for malignant neoplasm of cervix: Secondary | ICD-10-CM | POA: Diagnosis not present

## 2021-11-25 DIAGNOSIS — Z01419 Encounter for gynecological examination (general) (routine) without abnormal findings: Secondary | ICD-10-CM | POA: Diagnosis not present

## 2021-12-08 DIAGNOSIS — F900 Attention-deficit hyperactivity disorder, predominantly inattentive type: Secondary | ICD-10-CM | POA: Diagnosis not present

## 2021-12-08 DIAGNOSIS — F429 Obsessive-compulsive disorder, unspecified: Secondary | ICD-10-CM | POA: Diagnosis not present

## 2022-01-06 DIAGNOSIS — F429 Obsessive-compulsive disorder, unspecified: Secondary | ICD-10-CM | POA: Diagnosis not present

## 2022-01-06 DIAGNOSIS — F900 Attention-deficit hyperactivity disorder, predominantly inattentive type: Secondary | ICD-10-CM | POA: Diagnosis not present

## 2022-02-02 DIAGNOSIS — F429 Obsessive-compulsive disorder, unspecified: Secondary | ICD-10-CM | POA: Diagnosis not present

## 2022-02-02 DIAGNOSIS — F900 Attention-deficit hyperactivity disorder, predominantly inattentive type: Secondary | ICD-10-CM | POA: Diagnosis not present

## 2022-02-18 DIAGNOSIS — F429 Obsessive-compulsive disorder, unspecified: Secondary | ICD-10-CM | POA: Diagnosis not present

## 2022-02-18 DIAGNOSIS — F900 Attention-deficit hyperactivity disorder, predominantly inattentive type: Secondary | ICD-10-CM | POA: Diagnosis not present

## 2022-03-04 DIAGNOSIS — F900 Attention-deficit hyperactivity disorder, predominantly inattentive type: Secondary | ICD-10-CM | POA: Diagnosis not present

## 2022-03-04 DIAGNOSIS — F429 Obsessive-compulsive disorder, unspecified: Secondary | ICD-10-CM | POA: Diagnosis not present

## 2022-03-05 DIAGNOSIS — F900 Attention-deficit hyperactivity disorder, predominantly inattentive type: Secondary | ICD-10-CM | POA: Diagnosis not present

## 2022-03-05 DIAGNOSIS — F429 Obsessive-compulsive disorder, unspecified: Secondary | ICD-10-CM | POA: Diagnosis not present

## 2022-03-19 DIAGNOSIS — F422 Mixed obsessional thoughts and acts: Secondary | ICD-10-CM | POA: Diagnosis not present

## 2022-03-19 DIAGNOSIS — F900 Attention-deficit hyperactivity disorder, predominantly inattentive type: Secondary | ICD-10-CM | POA: Diagnosis not present

## 2022-04-13 DIAGNOSIS — F900 Attention-deficit hyperactivity disorder, predominantly inattentive type: Secondary | ICD-10-CM | POA: Diagnosis not present

## 2022-04-13 DIAGNOSIS — F422 Mixed obsessional thoughts and acts: Secondary | ICD-10-CM | POA: Diagnosis not present

## 2022-04-16 DIAGNOSIS — F422 Mixed obsessional thoughts and acts: Secondary | ICD-10-CM | POA: Diagnosis not present

## 2022-04-16 DIAGNOSIS — F900 Attention-deficit hyperactivity disorder, predominantly inattentive type: Secondary | ICD-10-CM | POA: Diagnosis not present

## 2022-04-30 DIAGNOSIS — F422 Mixed obsessional thoughts and acts: Secondary | ICD-10-CM | POA: Diagnosis not present

## 2022-04-30 DIAGNOSIS — F900 Attention-deficit hyperactivity disorder, predominantly inattentive type: Secondary | ICD-10-CM | POA: Diagnosis not present

## 2022-05-11 ENCOUNTER — Ambulatory Visit (INDEPENDENT_AMBULATORY_CARE_PROVIDER_SITE_OTHER): Payer: Federal, State, Local not specified - PPO | Admitting: Dermatology

## 2022-05-11 DIAGNOSIS — L2081 Atopic neurodermatitis: Secondary | ICD-10-CM

## 2022-05-11 DIAGNOSIS — Z79899 Other long term (current) drug therapy: Secondary | ICD-10-CM | POA: Diagnosis not present

## 2022-05-11 DIAGNOSIS — L853 Xerosis cutis: Secondary | ICD-10-CM

## 2022-05-11 DIAGNOSIS — L299 Pruritus, unspecified: Secondary | ICD-10-CM

## 2022-05-11 MED ORDER — ADBRY 150 MG/ML ~~LOC~~ SOSY: 600.0000 mg | PREFILLED_SYRINGE | Freq: Once | SUBCUTANEOUS | 0 refills | Status: AC

## 2022-05-11 MED ORDER — ADBRY 150 MG/ML ~~LOC~~ SOSY: 300.0000 mg | PREFILLED_SYRINGE | SUBCUTANEOUS | 6 refills | Status: DC

## 2022-05-11 MED ORDER — MUPIROCIN 2 % EX OINT: 1.0000 | TOPICAL_OINTMENT | Freq: Every day | CUTANEOUS | 3 refills | Status: DC

## 2022-05-11 NOTE — Progress Notes (Signed)
   Follow-Up Visit   Subjective  Audrey Mullins is a 31 y.o. female who presents for the following: Eczema (Face, while, pt interested in Dupixent, Mometasone cr prn, Aquaphor, Tower 28 spray otc spray for eczema, pt flares in summer and winter, flares with sweating, hot showers, pt sleeps well does not scratch during night that she is aware, but does itch during day). She flared recently, but now a little better.  The following portions of the chart were reviewed this encounter and updated as appropriate:   Tobacco  Allergies  Meds  Problems  Med Hx  Surg Hx  Fam Hx     Review of Systems:  No other skin or systemic complaints except as noted in HPI or Assessment and Plan.  Objective  Well appearing patient in no apparent distress; mood and affect are within normal limits.  A focused examination was performed including face, arms, legs. Relevant physical exam findings are noted in the Assessment and Plan.  face, arms, legs Pink patches antecubital face, generalized xerosis, minimal roughness popliteal Photos of severe involvement on face 02/2022  trunk, extremities Generalized xerosis   Assessment & Plan  Atopic neurodermatitis face, arms, legs Chronic and persistent condition with duration or expected duration over one year. Condition is symptomatic / bothersome to patient. Not to goal. Recent Flare Atopic dermatitis (eczema) is a chronic, relapsing, pruritic condition that can significantly affect quality of life. It is often associated with allergic rhinitis and/or asthma and can require treatment with topical medications, phototherapy, or in severe cases biologic injectable medication (Dupixent; Adbry) or Oral JAK inhibitors.   Recommend starting Adbry sq injections q 2 wks Adbry 600mg  sq injections today to L upper arm 4 injections X 150 each , Lot , exp 07/2022 Start Mupirocin oint qd to open sores D/c Mometasone  Tralokinumab (Adbry) is a treatment given by  injection for adults with moderate-to-severe atopic dermatitis. Goal is control of skin condition, not cure. It is given as 4 injections at the first dose followed by 2 injections ever 2 weeks thereafter.  Potential side effects include allergic reaction, injection site reactions and conjunctivitis (inflammation of the eyes).  The use of Adbry requires long term medication management, including periodic office visits.   Tralokinumab-ldrm (ADBRY) 150 MG/ML SOSY - face, arms, legs Inject 4 mLs (600 mg total) into the skin once for 1 dose. On day 1.  mupirocin ointment (BACTROBAN) 2 % - face, arms, legs Apply 1 Application topically daily. Qd to open sores until healed  Tralokinumab-ldrm (ADBRY) 150 MG/ML SOSY - face, arms, legs Inject 2 mLs (300 mg total) into the skin every 14 (fourteen) days. Starting on day 15 for maintenance.  Xerosis cutis trunk, extremities Recommend mild soap and moisturizer  Return in about 2 weeks (around 05/25/2022) for Atopic Derm.  I, 05/27/2022, RMA, am acting as scribe for Ardis Rowan, MD . Documentation: I have reviewed the above documentation for accuracy and completeness, and I agree with the above.  Armida Sans, MD

## 2022-05-11 NOTE — Patient Instructions (Signed)
Due to recent changes in healthcare laws, you may see results of your pathology and/or laboratory studies on MyChart before the doctors have had a chance to review them. We understand that in some cases there may be results that are confusing or concerning to you. Please understand that not all results are received at the same time and often the doctors may need to interpret multiple results in order to provide you with the best plan of care or course of treatment. Therefore, we ask that you please give us 2 business days to thoroughly review all your results before contacting the office for clarification. Should we see a critical lab result, you will be contacted sooner.   If You Need Anything After Your Visit  If you have any questions or concerns for your doctor, please call our main line at [phone] and press option 4 to reach your doctor's medical assistant. If no one answers, please leave a voicemail as directed and we will return your call as soon as possible. Messages left after 4 pm will be answered the following business day.   You may also send us a message via MyChart. We typically respond to MyChart messages within 1-2 business days.  For prescription refills, please ask your pharmacy to contact our office. Our fax number is [phone].  If you have an urgent issue when the clinic is closed that cannot wait until the next business day, you can page your doctor at the number below.    Please note that while we do our best to be available for urgent issues outside of office hours, we are not available 24/7.   If you have an urgent issue and are unable to reach us, you may choose to seek medical care at your doctor's office, retail clinic, urgent care center, or emergency room.  If you have a medical emergency, please immediately call 911 or go to the emergency department.  Pager Numbers  - Dr. Kowalski: [phone]  - Dr. Moye: [phone]  - Dr. Stewart:  [phone]  In the event of inclement weather, please call our main line at [phone] for an update on the status of any delays or closures.  Dermatology Medication Tips: Please keep the boxes that topical medications come in in order to help keep track of the instructions about where and how to use these. Pharmacies typically print the medication instructions only on the boxes and not directly on the medication tubes.   If your medication is too expensive, please contact our office at [phone] option 4 or send us a message through MyChart.   We are unable to tell what your co-pay for medications will be in advance as this is different depending on your insurance coverage. However, we may be able to find a substitute medication at lower cost or fill out paperwork to get insurance to cover a needed medication.   If a prior authorization is required to get your medication covered by your insurance company, please allow us 1-2 business days to complete this process.  Drug prices often vary depending on where the prescription is filled and some pharmacies may offer cheaper prices.  The website www.goodrx.com contains coupons for medications through different pharmacies. The prices here do not account for what the cost may be with help from insurance (it may be cheaper with your insurance), but the website can give you the price if you did not use any insurance.  - You can print the associated coupon and take it with   your prescription to the pharmacy.  - You may also stop by our office during regular business hours and pick up a GoodRx coupon card.  - If you need your prescription sent electronically to a different pharmacy, notify our office through [facility] MyChart or by phone at [phone] option 4.     Si Usted Necesita Algo Despus de Su Visita  Tambin puede enviarnos un mensaje a travs de MyChart. Por lo general respondemos a los mensajes de MyChart en el transcurso de 1 a 2  das hbiles.  Para renovar recetas, por favor pida a su farmacia que se ponga en contacto con nuestra oficina. Nuestro nmero de fax es el [phone].  Si tiene un asunto urgente cuando la clnica est cerrada y que no puede esperar hasta el siguiente da hbil, puede llamar/localizar a su doctor(a) al nmero que aparece a continuacin.   Por favor, tenga en cuenta que aunque hacemos todo lo posible para estar disponibles para asuntos urgentes fuera del horario de oficina, no estamos disponibles las 24 horas del da, los 7 das de la semana.   Si tiene un problema urgente y no puede comunicarse con nosotros, puede optar por buscar atencin mdica  en el consultorio de su doctor(a), en una clnica privada, en un centro de atencin urgente o en una sala de emergencias.  Si tiene una emergencia mdica, por favor llame inmediatamente al 911 o vaya a la sala de emergencias.  Nmeros de bper  - Dr. Kowalski: [phone]  - Dra. Moye: [phone]  - Dra. Stewart: [phone]  En caso de inclemencias del tiempo, por favor llame a nuestra lnea principal al [phone] para una actualizacin sobre el estado de cualquier retraso o cierre.  Consejos para la medicacin en dermatologa: Por favor, guarde las cajas en las que vienen los medicamentos de uso tpico para ayudarle a seguir las instrucciones sobre dnde y cmo usarlos. Las farmacias generalmente imprimen las instrucciones del medicamento slo en las cajas y no directamente en los tubos del medicamento.   Si su medicamento es muy caro, por favor, pngase en contacto con nuestra oficina llamando al [phone] y presione la opcin 4 o envenos un mensaje a travs de MyChart.   No podemos decirle cul ser su copago por los medicamentos por adelantado ya que esto es diferente dependiendo de la cobertura de su seguro. Sin embargo, es posible que podamos encontrar un medicamento sustituto a menor costo o llenar un formulario para que el  seguro cubra el medicamento que se considera necesario.   Si se requiere una autorizacin previa para que su compaa de seguros cubra su medicamento, por favor permtanos de 1 a 2 das hbiles para completar este proceso.  Los precios de los medicamentos varan con frecuencia dependiendo del lugar de dnde se surte la receta y alguna farmacias pueden ofrecer precios ms baratos.  El sitio web www.goodrx.com tiene cupones para medicamentos de diferentes farmacias. Los precios aqu no tienen en cuenta lo que podra costar con la ayuda del seguro (puede ser ms barato con su seguro), pero el sitio web puede darle el precio si no utiliz ningn seguro.  - Puede imprimir el cupn correspondiente y llevarlo con su receta a la farmacia.  - Tambin puede pasar por nuestra oficina durante el horario de atencin regular y recoger una tarjeta de cupones de GoodRx.  - Si necesita que su receta se enve electrnicamente a una farmacia diferente, informe a nuestra oficina a travs de MyChart de [place]   o por telfono llamando al [phone] y presione la opcin 4.  

## 2022-05-14 DIAGNOSIS — F422 Mixed obsessional thoughts and acts: Secondary | ICD-10-CM | POA: Diagnosis not present

## 2022-05-14 DIAGNOSIS — F900 Attention-deficit hyperactivity disorder, predominantly inattentive type: Secondary | ICD-10-CM | POA: Diagnosis not present

## 2022-05-25 ENCOUNTER — Encounter: Payer: Self-pay | Admitting: Dermatology

## 2022-05-25 ENCOUNTER — Telehealth: Payer: Self-pay

## 2022-05-25 NOTE — Telephone Encounter (Signed)
Patients insurance is denying Adbry due to patient must have had an inadequate treatment response, or an intolerance to, or contraindication to a topical calcineurin inhibitor. Please advise

## 2022-05-26 ENCOUNTER — Ambulatory Visit: Payer: Federal, State, Local not specified - PPO | Admitting: Dermatology

## 2022-05-26 ENCOUNTER — Other Ambulatory Visit: Payer: Self-pay

## 2022-05-26 VITALS — BP 123/89 | HR 83

## 2022-05-26 DIAGNOSIS — Z79899 Other long term (current) drug therapy: Secondary | ICD-10-CM

## 2022-05-26 DIAGNOSIS — L2081 Atopic neurodermatitis: Secondary | ICD-10-CM

## 2022-05-26 MED ORDER — ADBRY 150 MG/ML ~~LOC~~ SOSY: 300.0000 mg | PREFILLED_SYRINGE | SUBCUTANEOUS | 0 refills | Status: AC

## 2022-05-26 MED ORDER — TACROLIMUS 0.1 % EX OINT: TOPICAL_OINTMENT | Freq: Two times a day (BID) | CUTANEOUS | 0 refills | Status: DC

## 2022-05-26 NOTE — Patient Instructions (Addendum)
Discontinue exfoliant Continue hypochlorous acid  Continue adbry - recommend continue Adbry for at least 2 more injections after today  Start tacrolimus oint daily  Cont mupirocin daily    Due to recent changes in healthcare laws, you may see results of your pathology and/or laboratory studies on MyChart before the doctors have had a chance to review them. We understand that in some cases there may be results that are confusing or concerning to you. Please understand that not all results are received at the same time and often the doctors may need to interpret multiple results in order to provide you with the best plan of care or course of treatment. Therefore, we ask that you please give [place] 2 business days to thoroughly review all your results before contacting the office for clarification. Should we see a critical lab result, you will be contacted sooner.   If You Need Anything After Your Visit  If you have any questions or concerns for your doctor, please call our main line at [phone] and press option 4 to reach your doctor's medical assistant. If no one answers, please leave a voicemail as directed and we will return your call as soon as possible. Messages left after 4 pm will be answered the following business day.   You may also send [place] a message via MyChart. We typically respond to MyChart messages within 1-2 business days.  For prescription refills, please ask your pharmacy to contact our office. Our fax number is [phone].  If you have an urgent issue when the clinic is closed that cannot wait until the next business day, you can page your doctor at the number below.    Please note that while we do our best to be available for urgent issues outside of office hours, we are not available 24/7.   If you have an urgent issue and are unable to reach [place], you may choose to seek medical care at your doctor's office, retail clinic, urgent care center, or emergency room.  If you  have a medical emergency, please immediately call 911 or go to the emergency department.  Pager Numbers  - Dr. Gwen Pounds: [phone]  - Dr. Neale Burly: [phone]  - Dr. Roseanne Reno: [phone]  In the event of inclement weather, please call our main line at [phone] for an update on the status of any delays or closures.  Dermatology Medication Tips: Please keep the boxes that topical medications come in in order to help keep track of the instructions about where and how to use these. Pharmacies typically print the medication instructions only on the boxes and not directly on the medication tubes.   If your medication is too expensive, please contact our office at [phone] option 4 or send [place] a message through MyChart.   We are unable to tell what your co-pay for medications will be in advance as this is different depending on your insurance coverage. However, we may be able to find a substitute medication at lower cost or fill out paperwork to get insurance to cover a needed medication.   If a prior authorization is required to get your medication covered by your insurance company, please allow [place] 1-2 business days to complete this process.  Drug prices often vary depending on where the prescription is filled and some pharmacies may offer cheaper prices.  The website www.goodrx.com contains coupons for medications through different pharmacies. The prices here do not account for what the cost may be with help from insurance (it may  be cheaper with your insurance), but the website can give you the price if you did not use any insurance.  - You can print the associated coupon and take it with your prescription to the pharmacy.  - You may also stop by our office during regular business hours and pick up a GoodRx coupon card.  - If you need your prescription sent electronically to a different pharmacy, notify our office through [facility] or by phone at [phone] option  4.     Si Usted Necesita Algo Despus de Su Visita  Tambin puede enviarnos un mensaje a travs de Clinical cytogeneticist. Por lo general respondemos a los mensajes de MyChart en el transcurso de 1 a 2 das hbiles.  Para renovar recetas, por favor pida a su farmacia que se ponga en contacto con nuestra oficina. Annie Sable de fax es [place] [phone].  Si tiene un asunto urgente cuando la clnica est cerrada y que no puede esperar hasta el siguiente da hbil, puede llamar/localizar a su doctor(a) al nmero que aparece a continuacin.   Por favor, tenga en cuenta que aunque hacemos todo lo posible para estar disponibles para asuntos urgentes fuera del horario de [place], no estamos disponibles las 24 horas del da, los 7 [address]  [address] de la [place].   Si tiene un problema urgente y no puede comunicarse con nosotros, puede optar por buscar atencin mdica  en el consultorio de su doctor(a), en una clnica privada, en un centro de atencin urgente o en una sala de emergencias.  Si tiene Engineer, drilling, por favor llame inmediatamente al 911 o vaya a la sala de emergencias.  Nmeros de bper  - Dr. Gwen Pounds: [phone]  - Dra. Moye: [phone]  - Dra. Roseanne Reno: [phone]  En caso de inclemencias del [place], por favor llame a Lacy Duverney principal al [phone] para una actualizacin sobre el [place] de cualquier retraso o cierre.  Consejos para la medicacin en dermatologa: Por favor, guarde las cajas en las que vienen los medicamentos de uso tpico para ayudarle a seguir las instrucciones sobre dnde y cmo usarlos. Las farmacias generalmente imprimen las instrucciones del medicamento slo en las cajas y no directamente en los tubos del [place].   Si su medicamento es muy caro, por favor, pngase en contacto con Rolm Gala llamando al [phone] y presione la opcin 4 o envenos un mensaje a travs de Clinical cytogeneticist.   No podemos decirle cul ser su copago por los medicamentos por  adelantado ya que esto es diferente dependiendo de la cobertura de su seguro. Sin embargo, es posible que podamos encontrar un medicamento sustituto a Audiological scientist un formulario para que el seguro cubra el medicamento que se considera necesario.   Si se requiere una autorizacin previa para que su compaa de seguros [place] su medicamento, por favor permtanos de 1 a 2 das hbiles para completar [address]  Los precios de los medicamentos varan con frecuencia dependiendo del Environmental consultant de dnde se surte la receta y alguna farmacias pueden ofrecer precios ms baratos.  El sitio web www.goodrx.com tiene cupones para medicamentos de Health and safety inspector. Los precios aqu no tienen en cuenta lo que podra costar con la ayuda del seguro (puede ser ms barato con su seguro), pero el sitio web puede darle el precio si no utiliz Tourist information centre manager.  - Puede imprimir el cupn correspondiente y llevarlo con su receta a la farmacia.  - Tambin puede pasar por nuestra oficina durante el horario de atencin regular y  recoger una tarjeta de cupones de GoodRx.  - Si necesita que su receta se enve electrnicamente a una farmacia diferente, informe a nuestra oficina a travs de MyChart de [place] o por telfono llamando al [phone] y presione la opcin 4.

## 2022-05-26 NOTE — Progress Notes (Signed)
   Follow-Up Visit   Subjective  Audrey Mullins is a 31 y.o. female who presents for the following: atopic dermatitis (Patient has notice an improvement in skin texture and less itching since Adbry and Protopic ointment).  The following portions of the chart were reviewed this encounter and updated as appropriate:   Tobacco  Allergies  Meds  Problems  Med Hx  Surg Hx  Fam Hx     Review of Systems:  No other skin or systemic complaints except as noted in HPI or Assessment and Plan.  Objective  Well appearing patient in no apparent distress; mood and affect are within normal limits.  A focused examination was performed including the face and extremities. Relevant physical exam findings are noted in the Assessment and Plan.   Assessment & Plan  Atopic neurodermatitis Face Chronic and persistent condition with duration or expected duration over one year. Condition is symptomatic / bothersome to patient. Not to goal. Recent Flare Atopic dermatitis (eczema) is a chronic, relapsing, pruritic condition that can significantly affect quality of life. It is often associated with allergic rhinitis and/or asthma and can require treatment with topical medications, phototherapy, or in severe cases biologic injectable medication (Dupixent; Adbry) or Oral JAK inhibitors.    Continue Adbry sq injections q 2 wks Continue Adbry - recommend continue Adbry for at least 2 more injections after today before changing treatment course if she is not continuing to improve. Adbry 300 mg sq injections today to right upper arm 2 injections X 150 each Lot [id number], exp 02/2023 Start tacrolimus oint qd to all affected areas Continue mupirocin to any open sores. Advised patient to discontinue exfoliant but she may continue hypochlorous acid  Tralokinumab (Adbry) is a treatment given by injection for adults with moderate-to-severe atopic dermatitis. Goal is control of skin condition, not cure. It is given as 4  injections at the first dose followed by 2 injections ever 2 weeks thereafter. Potential side effects include allergic reaction, injection site reactions and conjunctivitis (inflammation of the eyes).  The use of Adbry requires long term medication management, including periodic office visits.  Related Medications mupirocin ointment (BACTROBAN) 2 % Apply 1 Application topically daily. Qd to open sores until healed  Tralokinumab-ldrm (ADBRY) 150 MG/ML SOSY Inject 2 mLs (300 mg total) into the skin every 14 (fourteen) days. Starting on day 15 for maintenance.  Return in about 2 weeks (around 06/09/2022) for Adbry injection.  Maylene Roes, CMA, am acting as scribe for Armida Sans, MD . Documentation: I have reviewed the above documentation for accuracy and completeness, and I agree with the above.  Armida Sans, MD

## 2022-05-26 NOTE — Progress Notes (Signed)
Patients Audrey Mullins is being denied becase patient has not tried tacrolimus. Tacrolimus 0.1% ointment escripted in per Dr. Kirtland Bouchard. Message was left for patient to return call to go over this change with her and patient also has an appointment this afternoon for 2 week fu.

## 2022-06-01 DIAGNOSIS — F422 Mixed obsessional thoughts and acts: Secondary | ICD-10-CM | POA: Diagnosis not present

## 2022-06-01 DIAGNOSIS — F900 Attention-deficit hyperactivity disorder, predominantly inattentive type: Secondary | ICD-10-CM | POA: Diagnosis not present

## 2022-06-06 ENCOUNTER — Encounter: Payer: Self-pay | Admitting: Dermatology

## 2022-06-09 ENCOUNTER — Telehealth: Payer: Self-pay

## 2022-06-09 NOTE — Telephone Encounter (Signed)
Spoke with patient and advised her of information.   Prior authorization/appeal still pending. aw

## 2022-06-09 NOTE — Telephone Encounter (Signed)
[employer] Getting and Michelene Heady have been trying to contact patient.  [employer] can not continue their benefit investigation due to patient's address does not match the address on file with her insurance.  Left msg for patient to return my call. aw

## 2022-06-10 ENCOUNTER — Ambulatory Visit: Payer: Federal, State, Local not specified - PPO | Admitting: Dermatology

## 2022-06-10 ENCOUNTER — Encounter: Payer: Self-pay | Admitting: Dermatology

## 2022-06-10 VITALS — BP 126/86 | HR 76

## 2022-06-10 DIAGNOSIS — L2081 Atopic neurodermatitis: Secondary | ICD-10-CM

## 2022-06-10 DIAGNOSIS — L299 Pruritus, unspecified: Secondary | ICD-10-CM

## 2022-06-10 DIAGNOSIS — Z79899 Other long term (current) drug therapy: Secondary | ICD-10-CM

## 2022-06-10 MED ORDER — ADBRY 150 MG/ML ~~LOC~~ SOSY: 300.0000 mg | PREFILLED_SYRINGE | SUBCUTANEOUS | 0 refills | Status: AC

## 2022-06-10 NOTE — Patient Instructions (Addendum)
Continue Adbry sq injections q 2 wks Continue Adbry - recommend continue Adbry for at least 1 more injections after today before changing treatment course if not continuing to improve.  Continue  tacrolimus oint daily to all affected areas Continue mupirocin to any open sores. Continue hypochlorous acid   Due to recent changes in healthcare laws, you may see results of your pathology and/or laboratory studies on MyChart before the doctors have had a chance to review them. We understand that in some cases there may be results that are confusing or concerning to you. Please understand that not all results are received at the same time and often the doctors may need to interpret multiple results in order to provide you with the best plan of care or course of treatment. Therefore, we ask that you please give [place] 2 business days to thoroughly review all your results before contacting the office for clarification. Should we see a critical lab result, you will be contacted sooner.   If You Need Anything After Your Visit  If you have any questions or concerns for your doctor, please call our main line at [phone] and press option 4 to reach your doctor's medical assistant. If no one answers, please leave a voicemail as directed and we will return your call as soon as possible. Messages left after 4 pm will be answered the following business day.   You may also send [place] a message via MyChart. We typically respond to MyChart messages within 1-2 business days.  For prescription refills, please ask your pharmacy to contact our office. Our fax number is [phone].  If you have an urgent issue when the clinic is closed that cannot wait until the next business day, you can page your doctor at the number below.    Please note that while we do our best to be available for urgent issues outside of office hours, we are not available 24/7.   If you have an urgent issue and are unable to reach [place], you may  choose to seek medical care at your doctor's office, retail clinic, urgent care center, or emergency room.  If you have a medical emergency, please immediately call 911 or go to the emergency department.  Pager Numbers  - Dr. Gwen Pounds: [phone]  - Dr. Neale Burly: [phone]  - Dr. Roseanne Reno: [phone]  In the event of inclement weather, please call our main line at [phone] for an update on the status of any delays or closures.  Dermatology Medication Tips: Please keep the boxes that topical medications come in in order to help keep track of the instructions about where and how to use these. Pharmacies typically print the medication instructions only on the boxes and not directly on the medication tubes.   If your medication is too expensive, please contact our office at [phone] option 4 or send [place] a message through MyChart.   We are unable to tell what your co-pay for medications will be in advance as this is different depending on your insurance coverage. However, we may be able to find a substitute medication at lower cost or fill out paperwork to get insurance to cover a needed medication.   If a prior authorization is required to get your medication covered by your insurance company, please allow [place] 1-2 business days to complete this process.  Drug prices often vary depending on where the prescription is filled and some pharmacies may offer cheaper prices.  The website www.goodrx.com contains coupons for medications through different  pharmacies. The prices here do not account for what the cost may be with help from insurance (it may be cheaper with your insurance), but the website can give you the price if you did not use any insurance.  - You can print the associated coupon and take it with your prescription to the pharmacy.  - You may also stop by our office during regular business hours and pick up a GoodRx coupon card.  - If you need your prescription sent  electronically to a different pharmacy, notify our office through [facility] or by phone at [phone] option 4.     Si Usted Necesita Algo Despus de Su Visita  Tambin puede enviarnos un mensaje a travs de Clinical cytogeneticist. Por lo general respondemos a los mensajes de MyChart en el transcurso de 1 a 2 das hbiles.  Para renovar recetas, por favor pida a su farmacia que se ponga en contacto con nuestra oficina. Annie Sable de fax es [place] [phone].  Si tiene un asunto urgente cuando la clnica est cerrada y que no puede esperar hasta el siguiente da hbil, puede llamar/localizar a su doctor(a) al nmero que aparece a continuacin.   Por favor, tenga en cuenta que aunque hacemos todo lo posible para estar disponibles para asuntos urgentes fuera del horario de [place], no estamos disponibles las 24 horas del da, los 7 [address]  [address] de la [place].   Si tiene un problema urgente y no puede comunicarse con nosotros, puede optar por buscar atencin mdica  en el consultorio de su doctor(a), en una clnica privada, en un centro de atencin urgente o en una sala de emergencias.  Si tiene Engineer, drilling, por favor llame inmediatamente al 911 o vaya a la sala de emergencias.  Nmeros de bper  - Dr. Gwen Pounds: [phone]  - Dra. Moye: [phone]  - Dra. Roseanne Reno: [phone]  En caso de inclemencias del [place], por favor llame a Lacy Duverney principal al [phone] para una actualizacin sobre el [place] de cualquier retraso o cierre.  Consejos para la medicacin en dermatologa: Por favor, guarde las cajas en las que vienen los medicamentos de uso tpico para ayudarle a seguir las instrucciones sobre dnde y cmo usarlos. Las farmacias generalmente imprimen las instrucciones del medicamento slo en las cajas y no directamente en los tubos del [place].   Si su medicamento es muy caro, por favor, pngase en contacto con Rolm Gala llamando al [phone] y presione la  opcin 4 o envenos un mensaje a travs de Clinical cytogeneticist.   No podemos decirle cul ser su copago por los medicamentos por adelantado ya que esto es diferente dependiendo de la cobertura de su seguro. Sin embargo, es posible que podamos encontrar un medicamento sustituto a Audiological scientist un formulario para que el seguro cubra el medicamento que se considera necesario.   Si se requiere una autorizacin previa para que su compaa de seguros [place] su medicamento, por favor permtanos de 1 a 2 das hbiles para completar [address]  Los precios de los medicamentos varan con frecuencia dependiendo del Environmental consultant de dnde se surte la receta y alguna farmacias pueden ofrecer precios ms baratos.  El sitio web www.goodrx.com tiene cupones para medicamentos de Health and safety inspector. Los precios aqu no tienen en cuenta lo que podra costar con la ayuda del seguro (puede ser ms barato con su seguro), pero el sitio web puede darle el precio si no utiliz Tourist information centre manager.  - Puede imprimir el cupn correspondiente y llevarlo con su  receta a la farmacia.  - Tambin puede pasar por nuestra oficina durante el horario de atencin regular y Charity fundraiser una tarjeta de cupones de GoodRx.  - Si necesita que su receta se enve electrnicamente a una farmacia diferente, informe a nuestra oficina a travs de MyChart de [place] o por telfono llamando al [phone] y presione la opcin 4.

## 2022-06-10 NOTE — Progress Notes (Signed)
   Follow-Up Visit   Subjective  Audrey Mullins is a 31 y.o. female who presents for the following: Eczema (2 week recheck. On Adbry since 05/11/2022. Face and legs. Still breaking out at times but improves quicker. Using Tacrolimus as directed. Patient reports condition is improving. Denies adverse reactions or injection site reactions to Adbry).  The following portions of the chart were reviewed this encounter and updated as appropriate:  Tobacco  Allergies  Meds  Problems  Med Hx  Surg Hx  Fam Hx     Review of Systems: No other skin or systemic complaints except as noted in HPI or Assessment and Plan.  Objective  Well appearing patient in no apparent distress; mood and affect are within normal limits.  A focused examination was performed including face. Relevant physical exam findings are noted in the Assessment and Plan.  face, legs Erythema at cheeks and perioral   Assessment & Plan  Atopic neurodermatitis face, legs Chronic and persistent condition with duration or expected duration over one year. Condition is symptomatic / bothersome to patient. Not to goal, but much improved on Adbry. Atopic dermatitis (eczema) is a chronic, relapsing, pruritic condition that can significantly affect quality of life. It is often associated with allergic rhinitis and/or asthma and can require treatment with topical medications, phototherapy, or in severe cases biologic injectable medication (Dupixent; Adbry) or Oral JAK inhibitors.    Patient prefers auto injector.  Continue Adbry sq injections q 2 wks  Continue Adbry - recommend continue Adbry for at least 1 more injections after today before changing treatment course if she is not continuing to improve. Adbry 300 mg sq injections today to left upper arm 2 injections X 150 each Lot [id number], exp 02/2023 Continue  tacrolimus oint qd to all affected areas Continue mupirocin to any open sores. Continue hypochlorous acid  Tralokinumab-ldrm  (ADBRY) 150 MG/ML SOSY - face, legs Inject 2 mLs (300 mg total) into the skin every 14 (fourteen) days. Starting on day 15 for maintenance. Related Medications mupirocin ointment (BACTROBAN) 2 % Apply 1 Application topically daily. Qd to open sores until healed Tralokinumab-ldrm (ADBRY) 150 MG/ML SOSY Inject 2 mLs (300 mg total) into the skin every 14 (fourteen) days. Starting on day 15 for maintenance.  Return in about 2 weeks (around 06/24/2022) for Injection On Nurse Schedule, Atopic Dermatitis Follow up in 6 weeks.  I, Lawson Radar, CMA, am acting as scribe for Armida Sans, MD. Documentation: I have reviewed the above documentation for accuracy and completeness, and I agree with the above.  Armida Sans, MD

## 2022-06-11 DIAGNOSIS — F900 Attention-deficit hyperactivity disorder, predominantly inattentive type: Secondary | ICD-10-CM | POA: Diagnosis not present

## 2022-06-11 DIAGNOSIS — F422 Mixed obsessional thoughts and acts: Secondary | ICD-10-CM | POA: Diagnosis not present

## 2022-06-24 ENCOUNTER — Ambulatory Visit (INDEPENDENT_AMBULATORY_CARE_PROVIDER_SITE_OTHER): Payer: Federal, State, Local not specified - PPO

## 2022-06-24 DIAGNOSIS — L209 Atopic dermatitis, unspecified: Secondary | ICD-10-CM

## 2022-06-24 MED ORDER — ADBRY 150 MG/ML ~~LOC~~ SOSY: 300.0000 mg | PREFILLED_SYRINGE | SUBCUTANEOUS | 0 refills | Status: DC

## 2022-06-24 NOTE — Progress Notes (Signed)
Patient here today for Adbry injection for severe Atopic Dermatitis.   Adbry 300mg  injected into right arm. Patient tolerated well.   LOT: [id number] EXP:02/2023 NDC: 03/2023  [id number], RMA

## 2022-06-25 ENCOUNTER — Encounter: Payer: Self-pay | Admitting: Dermatology

## 2022-07-01 ENCOUNTER — Other Ambulatory Visit: Payer: Self-pay

## 2022-07-01 MED ORDER — ADBRY 150 MG/ML ~~LOC~~ SOSY: 300.0000 mg | PREFILLED_SYRINGE | SUBCUTANEOUS | 5 refills | Status: DC

## 2022-07-01 NOTE — Progress Notes (Signed)
Rx sent to preferred specialty pharmacy. aw

## 2022-07-22 ENCOUNTER — Ambulatory Visit: Payer: Federal, State, Local not specified - PPO | Admitting: Dermatology

## 2022-07-22 ENCOUNTER — Ambulatory Visit: Payer: BC Managed Care – PPO | Admitting: Dermatology

## 2022-08-16 ENCOUNTER — Ambulatory Visit: Payer: Federal, State, Local not specified - PPO | Admitting: Dermatology

## 2022-08-16 VITALS — BP 121/83

## 2022-08-16 DIAGNOSIS — Z79899 Other long term (current) drug therapy: Secondary | ICD-10-CM | POA: Diagnosis not present

## 2022-08-16 DIAGNOSIS — L2081 Atopic neurodermatitis: Secondary | ICD-10-CM

## 2022-08-16 NOTE — Patient Instructions (Signed)
Due to recent changes in healthcare laws, you may see results of your pathology and/or laboratory studies on MyChart before the doctors have had a chance to review them. We understand that in some cases there may be results that are confusing or concerning to you. Please understand that not all results are received at the same time and often the doctors may need to interpret multiple results in order to provide you with the best plan of care or course of treatment. Therefore, we ask that you please give us 2 business days to thoroughly review all your results before contacting the office for clarification. Should we see a critical lab result, you will be contacted sooner.   If You Need Anything After Your Visit  If you have any questions or concerns for your doctor, please call our main line at [phone] and press option 4 to reach your doctor's medical assistant. If no one answers, please leave a voicemail as directed and we will return your call as soon as possible. Messages left after 4 pm will be answered the following business day.   You may also send us a message via MyChart. We typically respond to MyChart messages within 1-2 business days.  For prescription refills, please ask your pharmacy to contact our office. Our fax number is [phone].  If you have an urgent issue when the clinic is closed that cannot wait until the next business day, you can page your doctor at the number below.    Please note that while we do our best to be available for urgent issues outside of office hours, we are not available 24/7.   If you have an urgent issue and are unable to reach us, you may choose to seek medical care at your doctor's office, retail clinic, urgent care center, or emergency room.  If you have a medical emergency, please immediately call 911 or go to the emergency department.  Pager Numbers  - Dr. Kowalski: [phone]  - Dr. Moye: [phone]  - Dr. Stewart:  [phone]  In the event of inclement weather, please call our main line at [phone] for an update on the status of any delays or closures.  Dermatology Medication Tips: Please keep the boxes that topical medications come in in order to help keep track of the instructions about where and how to use these. Pharmacies typically print the medication instructions only on the boxes and not directly on the medication tubes.   If your medication is too expensive, please contact our office at [phone] option 4 or send us a message through MyChart.   We are unable to tell what your co-pay for medications will be in advance as this is different depending on your insurance coverage. However, we may be able to find a substitute medication at lower cost or fill out paperwork to get insurance to cover a needed medication.   If a prior authorization is required to get your medication covered by your insurance company, please allow us 1-2 business days to complete this process.  Drug prices often vary depending on where the prescription is filled and some pharmacies may offer cheaper prices.  The website www.goodrx.com contains coupons for medications through different pharmacies. The prices here do not account for what the cost may be with help from insurance (it may be cheaper with your insurance), but the website can give you the price if you did not use any insurance.  - You can print the associated coupon and take it with   your prescription to the pharmacy.  - You may also stop by our office during regular business hours and pick up a GoodRx coupon card.  - If you need your prescription sent electronically to a different pharmacy, notify our office through [place] MyChart or by phone at [phone] option 4.     Si Usted Necesita Algo Despus de Su Visita  Tambin puede enviarnos un mensaje a travs de MyChart. Por lo general respondemos a los mensajes de MyChart en el transcurso de 1 a 2  das hbiles.  Para renovar recetas, por favor pida a su farmacia que se ponga en contacto con nuestra oficina. Nuestro nmero de fax es el [phone].  Si tiene un asunto urgente cuando la clnica est cerrada y que no puede esperar hasta el siguiente da hbil, puede llamar/localizar a su doctor(a) al nmero que aparece a continuacin.   Por favor, tenga en cuenta que aunque hacemos todo lo posible para estar disponibles para asuntos urgentes fuera del horario de oficina, no estamos disponibles las 24 horas del da, los 7 das de la semana.   Si tiene un problema urgente y no puede comunicarse con nosotros, puede optar por buscar atencin mdica  en el consultorio de su doctor(a), en una clnica privada, en un centro de atencin urgente o en una sala de emergencias.  Si tiene una emergencia mdica, por favor llame inmediatamente al 911 o vaya a la sala de emergencias.  Nmeros de bper  - Dr. Kowalski: [phone]  - Dra. Moye: [phone]  - Dra. Stewart: [phone]  En caso de inclemencias del tiempo, por favor llame a nuestra lnea principal al [phone] para una actualizacin sobre el estado de cualquier retraso o cierre.  Consejos para la medicacin en dermatologa: Por favor, guarde las cajas en las que vienen los medicamentos de uso tpico para ayudarle a seguir las instrucciones sobre dnde y cmo usarlos. Las farmacias generalmente imprimen las instrucciones del medicamento slo en las cajas y no directamente en los tubos del medicamento.   Si su medicamento es muy caro, por favor, pngase en contacto con nuestra oficina llamando al [phone] y presione la opcin 4 o envenos un mensaje a travs de MyChart.   No podemos decirle cul ser su copago por los medicamentos por adelantado ya que esto es diferente dependiendo de la cobertura de su seguro. Sin embargo, es posible que podamos encontrar un medicamento sustituto a menor costo o llenar un formulario para que el  seguro cubra el medicamento que se considera necesario.   Si se requiere una autorizacin previa para que su compaa de seguros cubra su medicamento, por favor permtanos de 1 a 2 das hbiles para completar este proceso.  Los precios de los medicamentos varan con frecuencia dependiendo del lugar de dnde se surte la receta y alguna farmacias pueden ofrecer precios ms baratos.  El sitio web www.goodrx.com tiene cupones para medicamentos de diferentes farmacias. Los precios aqu no tienen en cuenta lo que podra costar con la ayuda del seguro (puede ser ms barato con su seguro), pero el sitio web puede darle el precio si no utiliz ningn seguro.  - Puede imprimir el cupn correspondiente y llevarlo con su receta a la farmacia.  - Tambin puede pasar por nuestra oficina durante el horario de atencin regular y recoger una tarjeta de cupones de GoodRx.  - Si necesita que su receta se enve electrnicamente a una farmacia diferente, informe a nuestra oficina a travs de MyChart de [place]   o por telfono llamando al [phone] y presione la opcin 4.  

## 2022-08-16 NOTE — Progress Notes (Signed)
   Follow-Up Visit   Subjective  Audrey Mullins is a 32 y.o. female who presents for the following: Follow-up (Atopic derm follow up - Adbry injection every 2 weeks. She is doing very well. Skin feels smooth). She is no longer itching and is sleeping well.  The following portions of the chart were reviewed this encounter and updated as appropriate:   Tobacco  Allergies  Meds  Problems  Med Hx  Surg Hx  Fam Hx     Review of Systems:  No other skin or systemic complaints except as noted in HPI or Assessment and Plan.  Objective  Well appearing patient in no apparent distress; mood and affect are within normal limits.  A focused examination was performed including face. Relevant physical exam findings are noted in the Assessment and Plan.   Assessment & Plan  Atopic neurodermatitis Face, legs Chronic and persistent condition with duration or expected duration over one year. Condition is symptomatic / bothersome to patient. Not to goal, but much improved on Adbry. Atopic dermatitis (eczema) is a chronic, relapsing, pruritic condition that can significantly affect quality of life. It is often associated with allergic rhinitis and/or asthma and can require treatment with topical medications, phototherapy, or in severe cases biologic injectable medication (Dupixent; Adbry) or Oral JAK inhibitors.    Continue Adbry sq injections q 2 wks   Continue  tacrolimus oint qd to all affected areas Continue mupirocin to any open sores. Continue SOS Recovery cream and SOS Rescue hypochlorous acid spray for moisturizer  Do not recommend starting any anti-aging treatments (retinoids topically) at this time due to the potential dryness and irritation they can cause.  Long term medication management.  Patient is using long term (months to years) prescription medication  to control their dermatologic condition.  These medications require periodic monitoring to evaluate for efficacy and side effects  and may require periodic laboratory monitoring.  Related Medications Tralokinumab-ldrm (ADBRY) 150 MG/ML SOSY Inject 2 mLs (300 mg total) into the skin every 14 (fourteen) days. Starting on day 15 for maintenance.  Tralokinumab-ldrm (ADBRY) 150 MG/ML SOSY Inject 2 mLs (300 mg total) into the skin every 14 (fourteen) days. Starting on day 15 for maintenance.  Return today (on 08/16/2022) for 3-4 month Follow up.  I, Ashok Cordia, CMA, am acting as scribe for Sarina Ser, MD . Documentation: I have reviewed the above documentation for accuracy and completeness, and I agree with the above.  Sarina Ser, MD

## 2022-08-22 ENCOUNTER — Encounter: Payer: Self-pay | Admitting: Dermatology

## 2022-11-24 ENCOUNTER — Encounter: Payer: Self-pay | Admitting: Dermatology

## 2022-11-24 ENCOUNTER — Ambulatory Visit: Payer: Federal, State, Local not specified - PPO | Admitting: Dermatology

## 2022-11-24 VITALS — BP 117/81 | HR 61

## 2022-11-24 DIAGNOSIS — L209 Atopic dermatitis, unspecified: Secondary | ICD-10-CM | POA: Diagnosis not present

## 2022-11-24 DIAGNOSIS — Z79899 Other long term (current) drug therapy: Secondary | ICD-10-CM | POA: Diagnosis not present

## 2022-11-24 DIAGNOSIS — Z7189 Other specified counseling: Secondary | ICD-10-CM

## 2022-11-24 DIAGNOSIS — L2089 Other atopic dermatitis: Secondary | ICD-10-CM

## 2022-11-24 MED ORDER — TACROLIMUS 0.1 % EX OINT: TOPICAL_OINTMENT | Freq: Two times a day (BID) | CUTANEOUS | 5 refills | Status: AC

## 2022-11-24 NOTE — Patient Instructions (Addendum)
For Contacts  Recommend using saline when applying contacts,  rinse in saline solution before applying them to help avoid any irritation at eye area    Can used tacrolimus ointment on chapped lips if needed for dryness    Due to recent changes in healthcare laws, you may see results of your pathology and/or laboratory studies on MyChart before the doctors have had a chance to review them. We understand that in some cases there may be results that are confusing or concerning to you. Please understand that not all results are received at the same time and often the doctors may need to interpret multiple results in order to provide you with the best plan of care or course of treatment. Therefore, we ask that you please give [place] 2 business days to thoroughly review all your results before contacting the office for clarification. Should we see a critical lab result, you will be contacted sooner.   If You Need Anything After Your Visit  If you have any questions or concerns for your doctor, please call our main line at [phone] and press option 4 to reach your doctor's medical assistant. If no one answers, please leave a voicemail as directed and we will return your call as soon as possible. Messages left after 4 pm will be answered the following business day.   You may also send [place] a message via MyChart. We typically respond to MyChart messages within 1-2 business days.  For prescription refills, please ask your pharmacy to contact our office. Our fax number is [phone].  If you have an urgent issue when the clinic is closed that cannot wait until the next business day, you can page your doctor at the number below.    Please note that while we do our best to be available for urgent issues outside of office hours, we are not available 24/7.   If you have an urgent issue and are unable to reach [place], you may choose to seek medical care at your doctor's office, retail clinic, urgent care  center, or emergency room.  If you have a medical emergency, please immediately call 911 or go to the emergency department.  Pager Numbers  - Dr. Gwen Pounds: [phone]  - Dr. Neale Burly: [phone]  - Dr. Roseanne Reno: [phone]  In the event of inclement weather, please call our main line at [phone] for an update on the status of any delays or closures.  Dermatology Medication Tips: Please keep the boxes that topical medications come in in order to help keep track of the instructions about where and how to use these. Pharmacies typically print the medication instructions only on the boxes and not directly on the medication tubes.   If your medication is too expensive, please contact our office at [phone] option 4 or send [place] a message through MyChart.   We are unable to tell what your co-pay for medications will be in advance as this is different depending on your insurance coverage. However, we may be able to find a substitute medication at lower cost or fill out paperwork to get insurance to cover a needed medication.   If a prior authorization is required to get your medication covered by your insurance company, please allow [place] 1-2 business days to complete this process.  Drug prices often vary depending on where the prescription is filled and some pharmacies may offer cheaper prices.  The website www.goodrx.com contains coupons for medications through different pharmacies. The prices here do not account for what  the cost may be with help from insurance (it may be cheaper with your insurance), but the website can give you the price if you did not use any insurance.  - You can print the associated coupon and take it with your prescription to the pharmacy.  - You may also stop by our office during regular business hours and pick up a GoodRx coupon card.  - If you need your prescription sent electronically to a different pharmacy, notify our office through [facility] or by  phone at [phone] option 4.     Si Usted Necesita Algo Despus de Su Visita  Tambin puede enviarnos un mensaje a travs de Clinical cytogeneticist. Por lo general respondemos a los mensajes de MyChart en el transcurso de 1 a 2 das hbiles.  Para renovar recetas, por favor pida a su farmacia que se ponga en contacto con nuestra oficina. Annie Sable de fax es [place] [phone].  Si tiene un asunto urgente cuando la clnica est cerrada y que no puede esperar hasta el siguiente da hbil, puede llamar/localizar a su doctor(a) al nmero que aparece a continuacin.   Por favor, tenga en cuenta que aunque hacemos todo lo posible para estar disponibles para asuntos urgentes fuera del horario de [place], no estamos disponibles las 24 horas del da, los 7 [address]  [address] de la [place].   Si tiene un problema urgente y no puede comunicarse con nosotros, puede optar por buscar atencin mdica  en el consultorio de su doctor(a), en una clnica privada, en un centro de atencin urgente o en una sala de emergencias.  Si tiene Engineer, drilling, por favor llame inmediatamente al 911 o vaya a la sala de emergencias.  Nmeros de bper  - Dr. Gwen Pounds: [phone]  - Dra. Moye: [phone]  - Dra. Roseanne Reno: [phone]  En caso de inclemencias del [place], por favor llame a Lacy Duverney principal al [phone] para una actualizacin sobre el [place] de cualquier retraso o cierre.  Consejos para la medicacin en dermatologa: Por favor, guarde las cajas en las que vienen los medicamentos de uso tpico para ayudarle a seguir las instrucciones sobre dnde y cmo usarlos. Las farmacias generalmente imprimen las instrucciones del medicamento slo en las cajas y no directamente en los tubos del [place].   Si su medicamento es muy caro, por favor, pngase en contacto con Rolm Gala llamando al [phone] y presione la opcin 4 o envenos un mensaje a travs de Clinical cytogeneticist.   No podemos decirle cul ser su copago  por los medicamentos por adelantado ya que esto es diferente dependiendo de la cobertura de su seguro. Sin embargo, es posible que podamos encontrar un medicamento sustituto a Audiological scientist un formulario para que el seguro cubra el medicamento que se considera necesario.   Si se requiere una autorizacin previa para que su compaa de seguros [place] su medicamento, por favor permtanos de 1 a 2 das hbiles para completar [address]  Los precios de los medicamentos varan con frecuencia dependiendo del Environmental consultant de dnde se surte la receta y alguna farmacias pueden ofrecer precios ms baratos.  El sitio web www.goodrx.com tiene cupones para medicamentos de Health and safety inspector. Los precios aqu no tienen en cuenta lo que podra costar con la ayuda del seguro (puede ser ms barato con su seguro), pero el sitio web puede darle el precio si no utiliz Tourist information centre manager.  - Puede imprimir el cupn correspondiente y llevarlo con su receta a la farmacia.  - Tambin puede pasar  por nuestra oficina durante el horario de atencin regular y Education officer, museum una tarjeta de cupones de GoodRx.  - Si necesita que su receta se enve electrnicamente a una farmacia diferente, informe a nuestra oficina a travs de MyChart de [facility] o por telfono llamando al [phone] y presione la opcin 4.

## 2022-11-24 NOTE — Progress Notes (Signed)
   Follow-Up Visit   Subjective  Audrey Mullins is a 32 y.o. female who presents for the following: 3 - 4 atopic dermatitis , patient has been staying clear on Adbry injections every 2 weeks. Reports a recent flare at face due to travel but otherwise has been controlled.   The following portions of the chart were reviewed this encounter and updated as appropriate: medications, allergies, medical history  Review of Systems:  No other skin or systemic complaints except as noted in HPI or Assessment and Plan.  Objective  Well appearing patient in no apparent distress; mood and affect are within normal limits.  Areas Examined: Face and legs  Relevant physical exam findings are noted in the Assessment and Plan.   Assessment & Plan    ATOPIC DERMATITIS Face and legs Exam: pink patches at lower face  3% BSA Chronic and persistent condition with duration or expected duration over one year. Condition is bothersome/symptomatic for patient. Currently flared.  The month of May had flare due to travel, dry environment, stress,   Atopic dermatitis (eczema) is a chronic, relapsing, pruritic condition that can significantly affect quality of life. It is often associated with allergic rhinitis and/or asthma and can require treatment with topical medications, phototherapy, or in severe cases biologic injectable medication (Dupixent; Adbry) or Oral JAK inhibitors.  Treatment Plan: Continue Adbry sq injections q 2 wks   Continue  tacrolimus oint qd to all affected areas Continue mupirocin to any open sores. Continue SOS Recovery cream and SOS Rescue hypochlorous acid spray for moisturizer   Do not recommend starting any anti-aging treatments (retinoids topically) at this time due to the potential dryness and irritation they can cause.   Long term medication management.  Patient is using long term (months to years) prescription medication  to control their dermatologic condition.  These  medications require periodic monitoring to evaluate for efficacy and side effects and may require periodic laboratory monitoring.  Recommend gentle skin care.  Return in about 6 months (around 05/27/2023) for atopic derm.  IAsher Muir, CMA, am acting as scribe for Armida Sans, MD.  Documentation: I have reviewed the above documentation for accuracy and completeness, and I agree with the above.  Armida Sans, MD

## 2022-12-01 ENCOUNTER — Encounter: Payer: Self-pay | Admitting: Dermatology

## 2022-12-01 MED ORDER — ADBRY 150 MG/ML ~~LOC~~ SOSY: 300.0000 mg | PREFILLED_SYRINGE | SUBCUTANEOUS | 5 refills | Status: AC

## 2023-06-02 ENCOUNTER — Ambulatory Visit: Payer: Federal, State, Local not specified - PPO | Admitting: Dermatology

## 2023-07-18 ENCOUNTER — Other Ambulatory Visit: Payer: Self-pay | Admitting: Dermatology

## 2023-08-10 ENCOUNTER — Telehealth: Payer: Self-pay

## 2023-08-10 NOTE — Telephone Encounter (Signed)
Patient called requesting Rfs of her Adbry. She has made a follow up appt with you in May (first available) and last seen in May of 2024. Okay to give Rfs?

## 2023-08-11 MED ORDER — ADBRY 300 MG/2ML ~~LOC~~ SOAJ: 300.0000 mg | SUBCUTANEOUS | 3 refills | Status: AC

## 2023-08-11 NOTE — Telephone Encounter (Signed)
Refills sent in and left voicemail advising patient. aw

## 2023-11-11 ENCOUNTER — Other Ambulatory Visit: Payer: Self-pay | Admitting: Neurology

## 2023-11-11 DIAGNOSIS — G43711 Chronic migraine without aura, intractable, with status migrainosus: Secondary | ICD-10-CM

## 2023-11-15 ENCOUNTER — Ambulatory Visit: Payer: Federal, State, Local not specified - PPO | Admitting: Dermatology

## 2023-11-18 ENCOUNTER — Ambulatory Visit
Admission: RE | Admit: 2023-11-18 | Discharge: 2023-11-18 | Disposition: A | Discharge status: Home / Self Care | Source: Ambulatory Visit | Attending: Neurology | Admitting: Neurology

## 2023-11-18 ENCOUNTER — Other Ambulatory Visit: Payer: Self-pay | Admitting: Neurology

## 2023-11-18 DIAGNOSIS — G43711 Chronic migraine without aura, intractable, with status migrainosus: Secondary | ICD-10-CM | POA: Insufficient documentation

## 2023-11-23 ENCOUNTER — Telehealth: Payer: Self-pay

## 2023-11-23 NOTE — Telephone Encounter (Signed)
 Left message for patient to return call to schedule an appointment.

## 2023-11-23 NOTE — Telephone Encounter (Signed)
 Patient called requesting 3 month supply for Adbry , last seen May 2024. She is currently in [place] until [place]. Would you like to refill or hold until follow up appointment?  Please advise

## 2023-12-07 ENCOUNTER — Other Ambulatory Visit: Payer: Self-pay | Admitting: Dermatology
